# Patient Record
Sex: Female | Born: 1965
Health system: Southern US, Community
[De-identification: ages and names within clinical notes are randomized; demographics above are authoritative.]

## PROBLEM LIST (undated history)

## (undated) DIAGNOSIS — E669 Obesity, unspecified: Secondary | ICD-10-CM

## (undated) DIAGNOSIS — T7840XA Allergy, unspecified, initial encounter: Secondary | ICD-10-CM

## (undated) HISTORY — PX: DILATION AND CURETTAGE, DIAGNOSTIC / THERAPEUTIC: SUR384

## (undated) HISTORY — PX: MYOMECTOMY: SHX85

## (undated) HISTORY — DX: Obesity, unspecified: E66.9

## (undated) HISTORY — DX: Allergy, unspecified, initial encounter: T78.40XA

## (undated) HISTORY — PX: TONSILLECTOMY: SUR1361

---

## 1998-02-24 ENCOUNTER — Other Ambulatory Visit: Admission: RE | Admit: 1998-02-24 | Discharge: 1998-02-24 | Payer: Self-pay | Admitting: Obstetrics and Gynecology

## 1999-02-25 ENCOUNTER — Other Ambulatory Visit: Admission: RE | Admit: 1999-02-25 | Discharge: 1999-02-25 | Payer: Self-pay | Admitting: Obstetrics and Gynecology

## 2000-01-28 ENCOUNTER — Other Ambulatory Visit: Admission: RE | Admit: 2000-01-28 | Discharge: 2000-01-28 | Payer: Self-pay | Admitting: Obstetrics and Gynecology

## 2000-08-03 ENCOUNTER — Inpatient Hospital Stay (HOSPITAL_COMMUNITY): Admission: AD | Admit: 2000-08-03 | Discharge: 2000-08-06 | Payer: Self-pay | Admitting: Obstetrics and Gynecology

## 2000-08-10 ENCOUNTER — Encounter: Admission: RE | Admit: 2000-08-10 | Discharge: 2000-09-07 | Payer: Self-pay | Admitting: Obstetrics and Gynecology

## 2000-09-14 ENCOUNTER — Other Ambulatory Visit: Admission: RE | Admit: 2000-09-14 | Discharge: 2000-09-14 | Payer: Self-pay | Admitting: Obstetrics and Gynecology

## 2001-10-06 ENCOUNTER — Other Ambulatory Visit: Admission: RE | Admit: 2001-10-06 | Discharge: 2001-10-06 | Payer: Self-pay | Admitting: Obstetrics and Gynecology

## 2002-08-15 ENCOUNTER — Ambulatory Visit (HOSPITAL_COMMUNITY): Admission: RE | Admit: 2002-08-15 | Discharge: 2002-08-15 | Payer: Self-pay | Admitting: Obstetrics and Gynecology

## 2002-08-15 ENCOUNTER — Encounter (INDEPENDENT_AMBULATORY_CARE_PROVIDER_SITE_OTHER): Payer: Self-pay

## 2002-11-16 ENCOUNTER — Other Ambulatory Visit: Admission: RE | Admit: 2002-11-16 | Discharge: 2002-11-16 | Payer: Self-pay | Admitting: Obstetrics and Gynecology

## 2003-08-11 ENCOUNTER — Emergency Department (HOSPITAL_COMMUNITY): Admission: AD | Admit: 2003-08-11 | Discharge: 2003-08-11 | Payer: Self-pay | Admitting: Family Medicine

## 2003-08-16 ENCOUNTER — Emergency Department (HOSPITAL_COMMUNITY): Admission: AD | Admit: 2003-08-16 | Discharge: 2003-08-16 | Payer: Self-pay | Admitting: Family Medicine

## 2003-08-19 ENCOUNTER — Emergency Department (HOSPITAL_COMMUNITY): Admission: AD | Admit: 2003-08-19 | Discharge: 2003-08-19 | Payer: Self-pay | Admitting: Family Medicine

## 2003-12-04 ENCOUNTER — Other Ambulatory Visit: Admission: RE | Admit: 2003-12-04 | Discharge: 2003-12-04 | Payer: Self-pay | Admitting: Obstetrics and Gynecology

## 2004-12-28 ENCOUNTER — Other Ambulatory Visit: Admission: RE | Admit: 2004-12-28 | Discharge: 2004-12-28 | Payer: Self-pay | Admitting: Obstetrics and Gynecology

## 2005-02-15 ENCOUNTER — Emergency Department (HOSPITAL_COMMUNITY): Admission: EM | Admit: 2005-02-15 | Discharge: 2005-02-15 | Payer: Self-pay | Admitting: Family Medicine

## 2005-07-18 ENCOUNTER — Emergency Department (HOSPITAL_COMMUNITY): Admission: EM | Admit: 2005-07-18 | Discharge: 2005-07-18 | Payer: Self-pay | Admitting: Family Medicine

## 2005-10-21 ENCOUNTER — Ambulatory Visit: Payer: Self-pay

## 2008-06-30 HISTORY — PX: ABDOMINAL HYSTERECTOMY: SHX81

## 2008-07-22 ENCOUNTER — Ambulatory Visit (HOSPITAL_COMMUNITY): Admission: RE | Admit: 2008-07-22 | Discharge: 2008-07-23 | Payer: Self-pay | Admitting: Obstetrics and Gynecology

## 2008-07-22 ENCOUNTER — Encounter (INDEPENDENT_AMBULATORY_CARE_PROVIDER_SITE_OTHER): Payer: Self-pay | Admitting: Obstetrics and Gynecology

## 2010-09-08 ENCOUNTER — Emergency Department: Payer: Self-pay | Admitting: Internal Medicine

## 2011-01-12 NOTE — Op Note (Signed)
NAMETENITA, CUE                 ACCOUNT NO.:  [id number]   MEDICAL RECORD NO.:  [id number]          PATIENT TYPE:  OIB   LOCATION:  9317                          FACILITY:  [facility]   PHYSICIAN:  Guy Sandifer. Henderson Cloud, M.D. DATE OF BIRTH:  1966/06/04   DATE OF PROCEDURE:  07/22/2008  DATE OF DISCHARGE:                               OPERATIVE REPORT   PREOPERATIVE DIAGNOSIS:  Uterine leiomyomata.   POSTOPERATIVE DIAGNOSIS:  Uterine leiomyomata.   PROCEDURES:  1. Laparoscopically-assisted vaginal hysterectomy.  2. Biopsy of right fallopian tube.   SURGEON:  Guy Sandifer. Henderson Cloud, MD.   ASSISTANT:  Juluis Mire, MD.   ANESTHESIA:  General endotracheal intubation by Raul Del, MD.   SPECIMENS:  Uterus and biopsy of right fallopian tube to Pathology.   ESTIMATED BLOOD LOSS:  200 mL.   INDICATIONS AND CONSENT:  This patient is a 45 year old married white  female, G1, P1, with uterine leiomyomata and increasingly heavy menses.  Details are dictated in the history and physical.  Laparoscopically-  assisted vaginal hysterectomy and removal of the tube and ovary only if  distinctly abnormal was discussed preoperatively.  Potential risks and  complications have been discussed preoperatively including, but not  limited to infection, organ damage, bleeding requiring transfusion of  blood products with HIV and hepatitis acquisition, DVT, PE, pneumonia,  fistula formation, postoperative pain, dyspareunia, and laparotomy.  All  questions have been answered and consent is signed on the chart.   FINDINGS:  Upper abdomen is grossly normal.  Appendix is normal.  Uterus  is 6-8 weeks in size, irregular contour with intramural leiomyoma.  The  right fallopian tube has an adhesion to the posterior uterine fundus.  There is an 8-mm pedunculated paratubal cyst on the distal portion of  the right fallopian tube.  Left tube and ovary normal.  Anterior  posterior cul-de-sacs were normal.  The course of  the ureters were seen  to be well clear of surgery.   PROCEDURE:  The patient was taken to operating room where she was  identified and placed in dorsal supine position and general anesthesia  was induced via endotracheal intubation.  She was then placed in a  dorsal lithotomy position where she was prepped abdominally and  vaginally.  Bladder was straight catheterized.  Hulka tenaculum was  placed in the uterus as a manipulator, and she was draped in a sterile  fashion.  The infraumbilical and suprapubic areas were injected in the  midline with 5 mL of 1/2% plain Marcaine.  A small infraumbilical  incision was made and a disposable Veress needle was placed without  difficulty.  A normal syringe and drop test were noted.  A 2 L of gas  were then insufflated under low pressure with good tympany in the right  upper quadrant.  Veress needle was then removed.  A 10/11 XL bladeless  disposable trocar sleeve was placed using direct visualization with the  diagnostic laparoscope.  After placement, a small suprapubic incision  was made in the midline and a 5-mm disposable trocar sleeve was placed  under  direct visualization without difficulty.  The above findings were  noted.  The paratubal cyst on the right fallopian tube is removed and  sent to Pathology with the endoseal product.  The adhesion of the right  tube to the posterior uterine fundus was taken down in a similar  fashion.  The proximal ligaments were then taken down bilaterally to the  level of the vesicouterine peritoneum with good hemostasis.  Vesicouterine peritoneum was taken down cephalad laterally.  Instruments  were removed.  Pneumoperitoneum is reduced and attention is turned to  the vagina.  Posterior cul-de-sacs entered sharply.  Cervix is  circumscribed with unipolar cautery.  Mucosa is advanced sharply and  bluntly.  Progressive bites were taken with the LigaSure bipolar  handheld cautery device of the uterosacral  ligaments followed by the  bladder pillars, cardinal ligaments, and uterine vessels bilaterally.  Fundus was delivered posteriorly.  The remaining pedicles were taken  down with specimens delivered.  All suture will be 0 Monocryl unless  otherwise designated.  Uterosacral ligaments were plicated to the  vaginal cuff bilaterally with separate sutures.  They were then plicated  in the midline with a third suture.  Cuff was closed with figure-of-  eights.  Foley catheter was placed in the bladder and clear urine is  noted.  Attention was returned to the abdomen.  Small peritoneal  bleeders controlled with bipolar cautery with the endoseal product.  Copious irrigation is carried out.  An observation under reduced  pneumoperitoneum also reveals good hemostasis.  At the end of the case,  20 mL of 1/2% plain Marcaine is instilled into the peritoneal cavity.  All instruments were removed.  Umbilical incision and the suprapubic  incisions were closed with interrupted 3-0 Vicryl suture.  Dermabond was  placed on both incisions.  All counts were correct.  The patient is  awakened and taken to recovery room in stable condition.      Guy Sandifer Henderson Cloud, M.D.  Electronically Signed     JET/MEDQ  D:  07/22/2008  T:  07/22/2008  Job:  [id number]

## 2011-01-12 NOTE — H&P (Signed)
Tara Tanner, Tara Tanner                 ACCOUNT NO.:  [id number]   MEDICAL RECORD NO.:  [id number]          PATIENT TYPE:  AMB   LOCATION:                                FACILITY:  [facility]   PHYSICIAN:  Guy Sandifer. Henderson Cloud, M.D. DATE OF BIRTH:  [birth year]   DATE OF ADMISSION:  07/22/2008  DATE OF DISCHARGE:                              HISTORY & PHYSICAL   Admission on July 22, 2008.   CHIEF COMPLAINT:  Heavy menses.   HISTORY OF PRESENT ILLNESS:  This patient is a 45 year old, married  white female G1, P1 with known uterine leiomyomata who continues to have  worsening menses.  They are becoming quite heavy.  Ultrasound on  July 16, 2008 revealed a uterus measuring 7.7 x 4.0 x 5.8 cm.  Intramural fibroids measuring 1.9 and 1.5 cm are noted as well as a 2.2  cm subserosal fibroid.  Ovaries appear normal.  After discussion of  options, she is being admitted for laparoscopically-assisted vaginal  hysterectomy.  Potential risks and complications have been discussed  preoperatively.   PAST MEDICAL HISTORY:  History of abnormal Pap smear.   PAST SURGICAL HISTORY:  1. Uterine myomectomy.  2. D&C.   OBSTETRICAL HISTORY:  Cesarean section x1.   MEDICATIONS:  Birth control pill daily.   ALLERGIES:  CODEINE.   FAMILY HISTORY:  Positive for gallbladder disease, kidney disease,  osteoporosis, and arthritis.   SOCIAL HISTORY:  Denies tobacco, alcohol, or drug abuse.   REVIEW OF SYSTEMS:  NEURO:  Denies headache.  CARDIAC:  Denies chest pain.  PULMONARY:  Denies shortness of breath.   PHYSICAL EXAMINATION:  VITAL SIGNS:  Height 5 feet 4-1/4, weight 224.4  pounds, blood pressure 130/76.  LUNGS:  Clear to auscultation.  HEART:  Regular rate and rhythm.  ABDOMEN:  Soft and nontender without masses.  PELVIC:  Vulvovaginal and cervix without lesion.  Uterus is 6-8 weeks in  size.  Adnexa nontender without masses.  EXTREMITIES:  Grossly within normal limits.  NEUROLOGICAL:  Grossly within  normal limits.   ASSESSMENT:  Menorrhagia, fibroids.   PLAN:  Laparoscopically-assisted vaginal hysterectomy.      Guy Sandifer Henderson Cloud, M.D.  Electronically Signed     JET/MEDQ  D:  07/16/2008  T:  07/17/2008  Job:  [id number]

## 2011-01-12 NOTE — Discharge Summary (Signed)
NAMESIBLEY, ROLISON                 ACCOUNT NO.:  [id number]   MEDICAL RECORD NO.:  [id number]          PATIENT TYPE:  OIB   LOCATION:  9317                          [facility]ACILITY:  [facility]   PHYSICIAN:  Guy Sandifer. Henderson Cloud, M.D. DATE OF BIRTH:  December 21, 1965   DATE OF ADMISSION:  07/22/2008  DATE OF DISCHARGE:                               DISCHARGE SUMMARY   ADMITTING DIAGNOSIS:  Uterine leiomyomata.   DISCHARGE DIAGNOSIS:  Uterine leiomyomata.   PROCEDURE:  On July 22, 2008 is laparoscopically-assisted vaginal  hysterectomy and biopsy of right fallopian tube.   REASON FOR ADMISSION:  This patient is a 45 year old married white  female G1, P1 with increasingly symptomatic uterine leiomyomata.  Details dictated in history and physical.  She is admitted for surgical  management.   HOSPITAL COURSE:  The patient admitted to the hospital and undergoes the  above procedure.  Estimated blood loss is 200 mL.  On the evening of  surgery, she has good pain relief.  Vital signs are stable.  She is  afebrile with clear urine output.  On the day of discharge, she has  passed flatus, tolerating regular diet, and ambulating.  Vital signs are  stable.  She is afebrile.  Hemoglobin is 11.0.  Pathology is pending.   CONDITION ON DISCHARGE:  Good.   DIET:  Regular as tolerated.   ACTIVITY:  No vaginal entry, no heavy lifting, and no operation of  automobiles.  She is to call the office for problems including, but not  limited to heavy bleeding, temperature of 101 degrees, persistent  nausea, vomiting, or increasing pain.   CURRENT MEDICATIONS:  1. Percocet 5/325 mg, #40 1-2 p.o. q.6 h. p.r.n.  2. Ibuprofen q.6 h. p.r.n.  3. Multivitamin daily.  4. Follow up is in the office in 2 weeks.      Guy Sandifer Henderson Cloud, M.D.  Electronically Signed     JET/MEDQ  D:  07/23/2008  T:  07/23/2008  Job:  [id number]

## 2011-01-15 NOTE — Discharge Summary (Signed)
[facility]  Patient:    Tara Tanner, Tara Tanner                        MRN: [id number] Adm. Date:  [id number] Disc. Date: [id number] Attending:  Cordelia Pen Ii Dictator:   Danie Chandler, R.N.                           Discharge Summary  ADMITTING DIAGNOSES:          1. Intrauterine pregnancy at term.                               2. Status post uterine myomectomy.                               3. Spontaneous rupture of membranes.  DISCHARGE DIAGNOSES:          1. Intrauterine pregnancy at term.                               2. Status post uterine myomectomy.                               3. Spontaneous rupture of membranes.  PROCEDURE:                    On August 03, 2000 primary low transverse cesarean section.  REASON FOR ADMISSION:         The patient is a 45 year old married white female gravida 1, para 0 at term who had a previous uterine myomectomy and was scheduled for repeat cesarean section.  The patient also had mild elevation of blood pressures which were treated with additional rest and no further symptomatology.  [facility] and had spontaneous rupture of membranes after arriving at the hospital.  This was followed by mild uterine contractions.  Fetal heart tones remained reactive on the monitor.  HOSPITAL COURSE:              The patient was taken to the operating room and underwent the above named procedure without complication.  This was productive of a viable female infant with Apgars of 8 at one minute and 9 at five minutes and an arterial cord pH of 7.30.  Postoperatively the patient did well.  On postoperative day #1 the patient had a good return of bowel function, was tolerating a regular diet.  She also had good pain control and was ambulating well without difficulty.  Her hemoglobin on this day was 11.1, hematocrit 31.1, and white blood cell count 12.2.  On  postoperative day #2 the patient was without complaint and she was discharged home on postoperative day #3.  CONDITION ON DISCHARGE:       Good.  DIET:                         Regular, as tolerated.  ACTIVITY:                     No heavy lifting, no driving, no vaginal entry.  FOLLOW-UP:  She is to follow up in the office in one to two weeks for incision check.  She is to call for temperature greater than 100 degrees, persistent nausea or vomiting, heavy vaginal bleeding, and/or redness or drainage from the incision site.  DISCHARGE MEDICATIONS:        1. Prenatal vitamin one p.o. q.d.                               2. Motrin 600 mg one p.o. q.6h. p.r.n. pain.                               3. Percocet #30 one to two p.o. q.4h. p.r.n.                                  pain. DD:  08/31/00 TD:  08/31/00 Job: [id number]

## 2011-01-15 NOTE — Op Note (Signed)
[facility]  Patient:    Tara Tanner, Tara Tanner                        MRN: [id number] Proc. Date: 08/03/00 Adm. Date:  [id number] Attending:  Cordelia Pen Ii                           Operative Report  PREOPERATIVE DIAGNOSES:       1. Intrauterine pregnancy at term.                               2. Status post uterine myomectomy.                               3. Spontaneous rupture of membranes.  POSTOPERATIVE DIAGNOSES:      1. Intrauterine pregnancy at term.                               2. Status post uterine myomectomy.                               3. Spontaneous rupture of membranes.  OPERATION:                    Low transverse cesarean section.  SURGEON:                      Guy Sandifer. Arleta Creek, M.D.  ASSISTANT:                    Beather Arbour. Thomasena Edis, M.D.  ANESTHESIA:                   Spinal anesthesia per Belva Agee, M.D.  ESTIMATED BLOOD LOSS:         800 cc.  FINDINGS:                     A viable female infant with Apgars of 8 and 9 at one and five minutes, respectively.  Birth weight was 6 pounds 11 ounces. Arterial cord pH 7.30.  INDICATIONS AND CONSENT:      This patient is a 45 year old married white female G1, P0 at term who has had previous uterine myomectomy and is scheduled for repeat cesarean section.  She has also had mild elevation of blood pressures which were treated with additional rest and no further symptomatology.  Group B beta strep culture is positive. She presents to [facility] for her scheduled cesarean section and had spontaneous rupture of membranes after arriving at the hospital.  This is followed by mild uterine contractions. Fetal heart tones are reactive on the monitor.  The patient is without complaints of central nervous system changes or epigastric pain.  Cesarean section has been discussed with the patient and all questions have been answered.  DESCRIPTION OF PROCEDURE:     The patient is  taken to the operating room where a spinal anesthetic is placed.  She is then placed in the dorsal supine position with a 15 degree left lateral wedge.  She is prepped abdominally. Foley catheter is placed is placed in the bladder as a drain and  she is draped in a sterile fashion.  After testing for adequate spinal anesthesia, the skin is entered through the previous low transverse scar and dissection is carried out to the peritoneum which is incised sharply and extended superiorly and inferiorly. The vesicouterine peritoneum is taken down cephalolaterally.  The bladder flap is developed and the bladder blade is placed.  The uterus is then incised in a low transverse manner and the uterine cavity is entered bluntly with a Kelly clamp.  The uterine incision is then extended cephalolaterally with the fingers. The vertex is then delivered without difficulty and the oropharynx and nasopharynx are suctioned.  The remainder of the infant is delivered and good cry and tone is noted.  The cord is clamped and cut and the infant is handed to the awaiting pediatrics team. The placenta is manually delivered.  The internal uterine contour is noted to be heart-shaped.  The cavity is clean.  The uterus is closed in a running locking fashion with 0 Monocryl suture which achieves good hemostasis.  Tubes and ovaries are normal. There is a 5 mm subserosal fibroid on the anterior fundus which is cauterized. The anterior peritoneum is then closed with 0 Monocryl suture which is also used to reapproximate the pyramidalis muscle in the midline.  The anterior rectus fascia is closed in a running fashion with PDS suture and the skin is closed with clips. All sponge, needle and instrument counts are correct and the patient is transferred to the recovery room in stable condition. DD:  08/03/00 TD:  08/03/00 Job: [id number]

## 2011-01-15 NOTE — Op Note (Signed)
NAME:  Tara Tanner, Tara Tanner                           ACCOUNT NO.:  [id number]   MEDICAL RECORD NO.:  [id number]                   PATIENT TYPE:  AMB   LOCATION:  SDC                                  FACILITY:  [facility]   PHYSICIAN:  Guy Sandifer. Arleta Creek, M.D.           DATE OF BIRTH:  1966/02/19   DATE OF PROCEDURE:  08/15/2002  DATE OF DISCHARGE:                                 OPERATIVE REPORT   PREOPERATIVE DIAGNOSIS:  Menorrhagia.   POSTOPERATIVE DIAGNOSIS:  Menorrhagia.   PROCEDURE:  Hysteroscopy with dilatation and curettage.   SURGEON:  Guy Sandifer. Henderson Cloud, M.D.   ANESTHESIA:  MAC with 1% Xylocaine paracervical block, 20 cc total.   ESTIMATED BLOOD LOSS:  Less than or equal to 50 cc.   INPUT AND OUTPUT:  Sorbitol distending media - 80 cc deficit with some of  that being on the floor.   SPECIMENS:  Endometrial curettings.   INDICATIONS AND CONSENT:  This patient is a 45 year old married white  female, G1, P1 with a known history of uterine myomata with postcoital  bleeding and menorrhagia.  Details dictated in History and Physical.  Hysteroscopy with possible resectoscope, dilatation and curettage was  discussed with the patient.  Potential risks and complications had been  discussed preoperatively including, but not limited to infection, uterine  perforation, bowel, bladder or ureteral damage, bleeding requiring  transfusion of blood products, possible transfusion reaction, HIV and  hepatitis acquisition, DVT, PE, pneumonia, hysterectomy, laparotomy,  postoperative intrauterine synechiae, and secondary infertility.  All  questions have been answered and consent was signed on the chart.   FINDINGS:  The endometrial cavity was without any abnormal structures or  vessels.   PROCEDURE:  The patient was taken to the operating room and placed in the  dorsal supine position, where she was given sedation.  She was then placed  in the dorsal lithotomy position, where was gently prepped,  bladder straight  catheterized, and she was draped in a sterile fashion.  Bivalve speculum was  placed in the vagina and anterior cervical lip was injected with 1%  Xylocaine and then grasped with a single-tooth tenaculum.  Paracervical  block had been placed in the 2, 4, 5, 7, 8, and 10 o'clock positions with  approximately 20 cc total of 1% Xylocaine plain.  The cervix was gently  progressively dilated to a 25 Pratt dilator.  Diagnostic hysteroscope was  placed in the cervical canal and advanced under direct visualization using  sorbitol distending media.  The above findings were noted.  The hysteroscope  was withdrawn and sharp curettage was carried out.  The hysteroscope was  then reintroduced and  again advanced under direct visualization and the cavity was totally clean.  The hysteroscope was withdrawn.  All counts were correct.  The patient was  awakened and taken to the recovery room in stable condition.  She did  receive preoperative IV antibiotics.  Guy Sandifer Arleta Creek, M.D.    JET/MEDQ  D:  08/15/2002  T:  08/15/2002  Job:  [id number]

## 2011-01-15 NOTE — H&P (Signed)
NAME:  Tara Tanner, Tara Tanner                           ACCOUNT NO.:  [id number]   MEDICAL RECORD NO.:  [id number]                   PATIENT TYPE:  AMB   LOCATION:  SDC                                  [facility]ACILITY:  [facility]   PHYSICIAN:  Guy Sandifer. Arleta Creek, M.D.           DATE OF BIRTH:  [birth year]   DATE OF ADMISSION:  08/15/2002  DATE OF DISCHARGE:                                HISTORY & PHYSICAL   CHIEF COMPLAINT:  Postcoital bleeding as well as heavy menses.   HISTORY OF PRESENT ILLNESS:  The patient is a 45 year old married white  female G1, P1 on condoms for contraception.  She has a history of uterine  leiomyomata and is status post uterine myomectomy in 1995.  She has  postcoital bleeding enough to stain the bed sheets.  She is also having  progressively heavier menses lasting 2 weeks at a time.  She is changing a  pad every 2 hours with blood clotting.  Physical examination reveals a  uterus that is upper limits of normal on size.  Ultrasound measures the  uterus at 7.6 x 4.9 x 3.8 cm with two leiomyomata noted to be 1.4 cm,  respectively.  Ovaries are normal.  Sonohistogram is consistent with a 5.6  mm density possibly representing a polyp.  The endometrial thickness is 9.2  mm.  After discussion of the options, she is being admitted for  hysteroscopy, dilation and curettage.   PAST MEDICAL HISTORY:  Environmental allergies.   PAST SURGICAL HISTORY:  1. Myomectomy in 1995.  2. Hernia repair in 1996.   OBSTETRIC HISTORY:  Cesarean section x1.   FAMILY HISTORY:  Heart attack in maternal grandfather.  Adult-onset diabetes  maternal aunt.  Juvenile diabetes in first cousin.  Lupus in maternal  grandfather.   MEDICATIONS:  None.   ALLERGY:  CODEINE leading to nausea and vomiting.   SOCIAL HISTORY:  The patient denies tobacco, alcohol, or drug abuse.   REVIEW OF SYSTEMS:  Negative except as above.   PHYSICAL EXAMINATION:  VITAL SIGNS:  Height 5 feet 5-1/2 inches, weight 205  pounds, blood pressure 108/60.  HEENT:  Without thyromegaly.  LUNGS:  Clear to auscultation.  HEART:  Regular rate and rhythm.  BACK:  Without CVA tenderness.  BREASTS:  Without mass, retraction, discharge.  ABDOMEN:  Soft, nontender, without masses.  PELVIC:  Vulva, vagina, cervix without lesion.  Uterus upper limits of  normal size, mobile, nontender.  Adnexa nontender without masses.  EXTREMITIES:  Grossly within normal limits.  NEUROLOGICAL:  Grossly within normal limits.   ASSESSMENT:  Postcoital bleeding and menorrhagia.   PLAN:  Hysteroscopy, D&C.                                               Fayrene Fearing  Enis Slipper, M.D.    JET/MEDQ  D:  08/13/2002  T:  08/13/2002  Job:  [id number]

## 2011-06-01 LAB — CBC
HCT: 31.3 — ABNORMAL LOW
HCT: 37.9
Hemoglobin: 11 — ABNORMAL LOW
Hemoglobin: 13.1
MCHC: 34.6
MCHC: 35.2
MCV: 87.2
MCV: 87.6
Platelets: 337
Platelets: 449 — ABNORMAL HIGH
RBC: 3.57 — ABNORMAL LOW
RBC: 4.34
RDW: 12.9
RDW: 12.9
WBC: 10.6 — ABNORMAL HIGH
WBC: 14 — ABNORMAL HIGH

## 2011-06-01 LAB — HCG, SERUM, QUALITATIVE: Preg, Serum: NEGATIVE

## 2011-11-15 ENCOUNTER — Encounter: Payer: Self-pay | Admitting: Family Medicine

## 2011-11-15 ENCOUNTER — Ambulatory Visit (INDEPENDENT_AMBULATORY_CARE_PROVIDER_SITE_OTHER): Payer: Medicare HMO | Admitting: Family Medicine

## 2011-11-15 VITALS — BP 110/78 | HR 60 | Temp 98.3°F | Ht 64.25 in | Wt 237.0 lb

## 2011-11-15 DIAGNOSIS — E669 Obesity, unspecified: Secondary | ICD-10-CM

## 2011-11-15 DIAGNOSIS — E66813 Obesity, class 3: Secondary | ICD-10-CM | POA: Insufficient documentation

## 2011-11-15 NOTE — Patient Instructions (Addendum)
Nice to meet you. Keep a food journal and try to increase physical activity. Please make an appointment to come see me in one month.

## 2011-11-15 NOTE — Progress Notes (Signed)
  Subjective:    Patient ID: Tara Tanner, female    DOB: Mar 06, 1966, 46 y.o.   MRN: [id number]  HPI  46 yo here to establish care.    G1P1 s/p vag hys due to fibroids, followed by Dr. Henderson Cloud.  Obesity- Was denied disability insurance due to her weight- BMI is 40.36. Brings in lab work from her assessment- (fasting): Glucose 55 TG 201, LDL 66, HDL 61 Cr 0.80  She used to be very active- ran 3 miles a day. Now snacks at work a lot, very sedentary job. When she gets home, too tired and busy with daughter to exercise.  Motivated to change.  Has tried [employer]- didn't like the point system.  Patient Active Problem List  Diagnoses  . Obesity   Past Medical History  Diagnosis Date  . Obesity    Past Surgical History  Procedure Date  . Abdominal hysterectomy 06/2008    vag hys secondary to Uterine leiomyomata  . Myomectomy    History  Substance Use Topics  . Smoking status: Never Smoker   . Smokeless tobacco: Not on file  . Alcohol Use: Not on file   Family History  Problem Relation Age of Onset  . Stroke Mother    Allergies  Allergen Reactions  . Codeine Nausea And Vomiting    Hallucinations   No current outpatient prescriptions on file prior to visit.   The PMH, PSH, Social History, Family History, Medications, and allergies have been reviewed in [facility], and have been updated if relevant.     Review of Systems See HPI    Objective:   Physical Exam BP 110/78  Pulse 60  Temp(Src) 98.3 F (36.8 C) (Oral)  Ht 5' 4.25" (1.632 m)  Wt 237 lb (107.502 kg)  BMI 40.36 kg/m2  General:  Well-developed,well-nourished,in no acute distress; alert,appropriate and cooperative throughout examination Head:  normocephalic and atraumatic.   Eyes:  vision grossly intact, pupils equal, pupils round, and pupils reactive to light.   Ears:  R ear normal and L ear normal.   Nose:  no external deformity.   Lungs:  Normal respiratory effort, chest expands symmetrically. Lungs are  clear to auscultation, no crackles or wheezes. Heart:  Normal rate and regular rhythm. S1 and S2 normal without gallop, murmur, click, rub or other extra sounds. Msk:  No deformity or scoliosis noted of thoracic or lumbar spine.   Extremities:  No clubbing, cyanosis, edema, or deformity noted with normal full range of motion of all joints.   Neurologic:  alert & oriented X3 and gait normal.   Skin:  Intact without suspicious lesions or rashes Psych:  Cognition and judgment appear intact. Alert and cooperative with normal attention span and concentration. No apparent delusions, illusions, hallucinations     Assessment and Plan: 1. Obesity    >30 min spent with face to face with patient, >50% counseling and/or coordinating care. Advised keeping food journal. Discussed phentermine.  She will attempt to loose weight on her own- improve diet and increase exercise for one month. Follow up with me in one month.

## 2011-12-16 ENCOUNTER — Encounter: Payer: Self-pay | Admitting: Family Medicine

## 2011-12-16 ENCOUNTER — Ambulatory Visit (INDEPENDENT_AMBULATORY_CARE_PROVIDER_SITE_OTHER): Payer: Medicare HMO | Admitting: Family Medicine

## 2011-12-16 VITALS — BP 130/80 | HR 60 | Temp 98.2°F | Wt 240.0 lb

## 2011-12-16 DIAGNOSIS — E669 Obesity, unspecified: Secondary | ICD-10-CM

## 2011-12-16 MED ORDER — PHENTERMINE HCL 15 MG PO CAPS: 15.0000 mg | ORAL_CAPSULE | ORAL | Status: DC

## 2011-12-16 NOTE — Patient Instructions (Signed)
Please follow up with me 1 month.    Phentermine tablets or capsules What is this medicine? PHENTERMINE (FEN ter meen) decreases your appetite. It is used with a reduced calorie diet and exercise to help you lose weight. This medicine may be used for other purposes; ask your health care provider or pharmacist if you have questions. What should I tell my health care provider before I take this medicine? They need to know if you have any of these conditions: -agitation -glaucoma -heart disease -high blood pressure -history of substance abuse -lung disease called Primary Pulmonary Hypertension (PPH) -taken an MAOI like Carbex, Eldepryl, Marplan, Nardil, or Parnate in last 14 days -thyroid disease -an unusual or allergic reaction to phentermine, other medicines, foods, dyes, or preservatives -pregnant or trying to get pregnant -breast-feeding How should I use this medicine? Take this medicine by mouth with a glass of water. Follow the directions on the prescription label. This medicine is usually taken 30 minutes before or 1 to 2 hours after breakfast. Avoid taking this medicine in the evening. It may interfere with sleep. Take your doses at regular intervals. Do not take your medicine more often than directed. Talk to your pediatrician regarding the use of this medicine in children. Special care may be needed. Overdosage: If you think you have taken too much of this medicine contact a poison control center or emergency room at once. NOTE: This medicine is only for you. Do not share this medicine with others. What if I miss a dose? If you miss a dose, take it as soon as you can. If it is almost time for your next dose, take only that dose. Do not take double or extra doses. What may interact with this medicine? Do not take this medicine with any of the following medications: -duloxetine -MAOIs like Carbex, Eldepryl, Marplan, Nardil, and Parnate -medicines for colds or breathing  difficulties like pseudoephedrine or phenylephrine -procarbazine -sibutramine -SSRIs like citalopram, escitalopram, fluoxetine, fluvoxamine, paroxetine, and sertraline -stimulants like dexmethylphenidate, methylphenidate or modafinil -venlafaxine This medicine may also interact with the following medications: -medicines for diabetes This list may not describe all possible interactions. Give your health care provider a list of all the medicines, herbs, non-prescription drugs, or dietary supplements you use. Also tell them if you smoke, drink alcohol, or use illegal drugs. Some items may interact with your medicine. What should I watch for while using this medicine? Notify your physician immediately if you become short of breath while doing your normal activities. Do not take this medicine within 6 hours of bedtime. It can keep you from getting to sleep. Avoid drinks that contain caffeine and try to stick to a regular bedtime every night. This medicine was intended to be used in addition to a healthy diet and exercise. The best results are achieved this way. This medicine is only indicated for short-term use. Eventually your weight loss may level out. At that point, the drug will only help you maintain your new weight. Do not increase or in any way change your dose without consulting your doctor. You may get drowsy or dizzy. Do not drive, use machinery, or do anything that needs mental alertness until you know how this medicine affects you. Do not stand or sit up quickly, especially if you are an older patient. This reduces the risk of dizzy or fainting spells. Alcohol may increase dizziness and drowsiness. Avoid alcoholic drinks. What side effects may I notice from receiving this medicine? Side effects that you should  report to your doctor or health care professional as soon as possible: -chest pain, palpitations -depression or severe changes in mood -increased blood  pressure -irritability -nervousness or restlessness -severe dizziness -shortness of breath -problems urinating -unusual swelling of the legs -vomiting Side effects that usually do not require medical attention (report to your doctor or health care professional if they continue or are bothersome): -blurred vision or other eye problems -changes in sexual ability or desire -constipation or diarrhea -difficulty sleeping -dry mouth or unpleasant taste -headache -nausea This list may not describe all possible side effects. Call your doctor for medical advice about side effects. You may report side effects to FDA at 1-800-FDA-1088. Where should I keep my medicine? Keep out of the reach of children. This medicine can be abused. Keep your medicine in a safe place to protect it from theft. Do not share this medicine with anyone. Selling or giving away this medicine is dangerous and against the law. Store at room temperature between 20 and 25 degrees C (68 and 77 degrees F). Keep container tightly closed. Throw away any unused medicine after the expiration date. NOTE: This sheet is a summary. It may not cover all possible information. If you have questions about this medicine, talk to your doctor, pharmacist, or health care provider.  2012, [employer]. (09/30/2010 11:02:44 AM)

## 2011-12-16 NOTE — Progress Notes (Signed)
  Subjective:    Patient ID: Tara Tanner, female    DOB: [birth year], 46 y.o.   MRN: [id number]  HPI  45 yo here to follow up obesity.  Has been exercising and cutting back. Lost 3 pounds since last office visit. She would like to go ahead and start phentermine.  She is snacking too much at work.  Needs something to suppress her appetite during the day.  No h/o CAD or palpitations. No h/o HTN.   Patient Active Problem List  Diagnoses  . Obesity   Past Medical History  Diagnosis Date  . Obesity    Past Surgical History  Procedure Date  . Abdominal hysterectomy 06/2008    vag hys secondary to Uterine leiomyomata  . Myomectomy    History  Substance Use Topics  . Smoking status: Never Smoker   . Smokeless tobacco: Not on file  . Alcohol Use: Not on file   Family History  Problem Relation Age of Onset  . Stroke Mother    Allergies  Allergen Reactions  . Codeine Nausea And Vomiting    Hallucinations    The PMH, PSH, Social History, Family History, Medications, and allergies have been reviewed in [facility], and have been updated if relevant.     Review of Systems See HPI    Objective:   Physical Exam BP 130/80  Pulse 60  Temp(Src) 98.2 F (36.8 C) (Oral)  Wt 240 lb (108.863 kg) Wt Readings from Last 3 Encounters:  12/16/11 240 lb (108.863 kg)  11/15/11 237 lb (107.502 kg)    General:  Well-developed,well-nourished,in no acute distress; alert,appropriate and cooperative throughout examination Head:  normocephalic and atraumatic.   Eyes:  vision grossly intact, pupils equal, pupils round, and pupils reactive to light.   Ears:  R ear normal and L ear normal.   Nose:  no external deformity.   Lungs:  Normal respiratory effort, chest expands symmetrically. Lungs are clear to auscultation, no crackles or wheezes. Heart:  Normal rate and regular rhythm. S1 and S2 normal without gallop, murmur, click, rub or other extra sounds. Msk:  No deformity or scoliosis  noted of thoracic or lumbar spine.   Extremities:  No clubbing, cyanosis, edema, or deformity noted with normal full range of motion of all joints.   Neurologic:  alert & oriented X3 and gait normal.   Skin:  Intact without suspicious lesions or rashes Psych:  Cognition and judgment appear intact. Alert and cooperative with normal attention span and concentration. No apparent delusions, illusions, hallucinations     Assessment and Plan: 1. Obesity    >15 min spent with face to face with patient, >50% counseling and/or coordinating care. Start phentermine- she is aware of risks, including HTN, pulmonary HTN, palpitations and would like to proceed. Pt to follow up in 1 month. See pt instructions for details.

## 2012-01-20 ENCOUNTER — Telehealth: Payer: Self-pay

## 2012-01-20 NOTE — Telephone Encounter (Signed)
Pt left v/m taking Phentermine 3 weeks and needs stronger dosage or med.[facility]

## 2012-01-20 NOTE — Telephone Encounter (Signed)
Needs to have monthly follow up in order to increase dose. We have to recheck BP before we can refill it.

## 2012-01-21 NOTE — Telephone Encounter (Signed)
Left message on voice mail asking pt to call back. 

## 2012-01-21 NOTE — Telephone Encounter (Signed)
Spoke with patient, she has appt next Friday.

## 2012-01-28 ENCOUNTER — Ambulatory Visit: Payer: Medicare HMO | Admitting: Family Medicine

## 2012-02-03 ENCOUNTER — Ambulatory Visit (INDEPENDENT_AMBULATORY_CARE_PROVIDER_SITE_OTHER): Payer: Medicare HMO | Admitting: Family Medicine

## 2012-02-03 ENCOUNTER — Encounter: Payer: Self-pay | Admitting: Family Medicine

## 2012-02-03 VITALS — BP 120/74 | HR 63 | Temp 98.3°F | Ht 65.0 in | Wt 236.8 lb

## 2012-02-03 DIAGNOSIS — E669 Obesity, unspecified: Secondary | ICD-10-CM

## 2012-02-03 MED ORDER — PHENTERMINE HCL 30 MG PO CAPS: 30.0000 mg | ORAL_CAPSULE | ORAL | Status: DC

## 2012-02-03 NOTE — Progress Notes (Signed)
  Subjective:    Patient ID: Tara Tanner, female    DOB: 29-Jul-1966, 46 y.o.   MRN: [id number]  HPI  46 yo here to follow up obesity.  Has been exercising and cutting back. Started phentermine 15 mg daily last month.  She is snacking too much at work.  Needs something to suppress her appetite during the day. Felt her appetite was not suppressed enough at this dose.  No adverse side effects- no palpitations, CP, SOB or insomnia.    Patient Active Problem List  Diagnoses  . Obesity   Past Medical History  Diagnosis Date  . Obesity    Past Surgical History  Procedure Date  . Abdominal hysterectomy 06/2008    vag hys secondary to Uterine leiomyomata  . Myomectomy    History  Substance Use Topics  . Smoking status: Never Smoker   . Smokeless tobacco: Not on file  . Alcohol Use: Not on file   Family History  Problem Relation Age of Onset  . Stroke Mother    Allergies  Allergen Reactions  . Codeine Nausea And Vomiting    Hallucinations    The PMH, PSH, Social History, Family History, Medications, and allergies have been reviewed in [facility], and have been updated if relevant.     Review of Systems See HPI    Objective:   Physical Exam BP 120/74  Pulse 63  Temp(Src) 98.3 F (36.8 C) (Oral)  Ht 5\' 5"  (1.651 m)  Wt 236 lb 12.8 oz (107.412 kg)  BMI 39.41 kg/m2  SpO2 97% Wt Readings from Last 3 Encounters:  02/03/12 236 lb 12.8 oz (107.412 kg)  12/16/11 240 lb (108.863 kg)  11/15/11 237 lb (107.502 kg)    General:  Well-developed,well-nourished,in no acute distress; alert,appropriate and cooperative throughout examination Head:  normocephalic and atraumatic.   Eyes:  vision grossly intact, pupils equal, pupils round, and pupils reactive to light.   Ears:  R ear normal and L ear normal.   Nose:  no external deformity.   Lungs:  Normal respiratory effort, chest expands symmetrically. Lungs are clear to auscultation, no crackles or wheezes. Heart:  Normal  rate and regular rhythm. S1 and S2 normal without gallop, murmur, click, rub or other extra sounds. Msk:  No deformity or scoliosis noted of thoracic or lumbar spine.   Extremities:  No clubbing, cyanosis, edema, or deformity noted with normal full range of motion of all joints.   Neurologic:  alert & oriented X3 and gait normal.   Skin:  Intact without suspicious lesions or rashes Psych:  Cognition and judgment appear intact. Alert and cooperative with normal attention span and concentration. No apparent delusions, illusions, hallucinations     Assessment and Plan: 1. Obesity    Unchanged. Increase dose of phentermine to 30 mg daily. Pt aware of risks, including HTN, pulmonary HTN, cardiac complications. Follow up in 1 month.

## 2012-03-06 ENCOUNTER — Ambulatory Visit (INDEPENDENT_AMBULATORY_CARE_PROVIDER_SITE_OTHER): Payer: Medicare HMO | Admitting: Family Medicine

## 2012-03-06 DIAGNOSIS — E669 Obesity, unspecified: Secondary | ICD-10-CM

## 2012-03-06 NOTE — Progress Notes (Signed)
  Subjective:    Patient ID: Tara Tanner, female    DOB: 1966/06/24, 46 y.o.   MRN: [id number]  HPI     No show.

## 2012-09-18 ENCOUNTER — Other Ambulatory Visit: Payer: Self-pay | Admitting: Family Medicine

## 2012-09-18 DIAGNOSIS — Z0000 Encounter for general adult medical examination without abnormal findings: Secondary | ICD-10-CM

## 2012-09-18 DIAGNOSIS — Z136 Encounter for screening for cardiovascular disorders: Secondary | ICD-10-CM

## 2012-09-22 ENCOUNTER — Other Ambulatory Visit (INDEPENDENT_AMBULATORY_CARE_PROVIDER_SITE_OTHER): Payer: Managed Care, Other (non HMO)

## 2012-09-22 DIAGNOSIS — Z136 Encounter for screening for cardiovascular disorders: Secondary | ICD-10-CM

## 2012-09-22 DIAGNOSIS — Z0000 Encounter for general adult medical examination without abnormal findings: Secondary | ICD-10-CM

## 2012-09-22 LAB — COMPREHENSIVE METABOLIC PANEL
ALT: 25 U/L (ref 0–35)
AST: 20 U/L (ref 0–37)
Albumin: 4.5 g/dL (ref 3.5–5.2)
BUN: 15 mg/dL (ref 6–23)
CO2: 28 mEq/L (ref 19–32)
Calcium: 10.1 mg/dL (ref 8.4–10.5)
Chloride: 102 mEq/L (ref 96–112)
Creatinine, Ser: 0.8 mg/dL (ref 0.4–1.2)
GFR: 81.41 mL/min (ref >60.00)
Glucose, Bld: 88 mg/dL (ref 70–99)
Potassium: 4 mEq/L (ref 3.5–5.1)
Sodium: 138 mEq/L (ref 135–145)
Total Bilirubin: 0.6 mg/dL (ref 0.3–1.2)
Total Protein: 8.2 g/dL (ref 6.0–8.3)

## 2012-09-22 LAB — LIPID PANEL
Cholesterol: 154 mg/dL (ref 0–200)
HDL: 63.1 mg/dL (ref >39.00)
Total CHOL/HDL Ratio: 2
VLDL: 16.4 mg/dL (ref 0.0–40.0)

## 2012-09-27 ENCOUNTER — Encounter: Payer: Medicare HMO | Admitting: Family Medicine

## 2012-10-06 ENCOUNTER — Ambulatory Visit (INDEPENDENT_AMBULATORY_CARE_PROVIDER_SITE_OTHER): Payer: Managed Care, Other (non HMO) | Admitting: Family Medicine

## 2012-10-06 ENCOUNTER — Encounter: Payer: Self-pay | Admitting: Family Medicine

## 2012-10-06 VITALS — BP 120/80 | HR 60 | Temp 98.2°F | Ht 64.25 in | Wt 230.0 lb

## 2012-10-06 DIAGNOSIS — Z0000 Encounter for general adult medical examination without abnormal findings: Secondary | ICD-10-CM

## 2012-10-06 DIAGNOSIS — D225 Melanocytic nevi of trunk: Secondary | ICD-10-CM

## 2012-10-06 DIAGNOSIS — E669 Obesity, unspecified: Secondary | ICD-10-CM

## 2012-10-06 DIAGNOSIS — D235 Other benign neoplasm of skin of trunk: Secondary | ICD-10-CM

## 2012-10-06 MED ORDER — PHENTERMINE HCL 30 MG PO CAPS: 30.0000 mg | ORAL_CAPSULE | ORAL | Status: AC

## 2012-10-06 NOTE — Patient Instructions (Addendum)
Great to see you. Please stop by to see Shirlee Limerick on your way out to set up your dermatology referral.  We are restarting phentermine.  Please come back in 1 month for a follow up.  Let me know if you have had your tetanus shot in the last 10 years.  Have a great weekend!

## 2012-10-06 NOTE — Progress Notes (Signed)
Subjective:    Patient ID: Tara Tanner, female    DOB: 1965/10/21, 47 y.o.   MRN: [id number]  HPI  47 yo here for CPX.  Obesity- tried phentermine this summer but was lost to follow up.  Had a new boss at work that would force her to cancel doctors appointments at last minute.  Situation is much better now.   She has lost 6 pounds since her last office visit- walking more and playing basketball with her 61 year old daughter.   Wt Readings from Last 3 Encounters:  10/06/12 230 lb (104.327 kg)  02/03/12 236 lb 12.8 oz (107.412 kg)  12/16/11 240 lb (108.863 kg)    No adverse side effects when she did take phentermine- no palpitations, CP, SOB or insomnia.  S/p Hysterectomy for DUB. Seeing Dr. Henderson Cloud at the end of the month. Mammogram already scheduled for the end of the month.   Lab Results  Component Value Date   CHOL 154 09/22/2012   HDL 63.10 09/22/2012   LDLCALC 75 09/22/2012   TRIG 82.0 09/22/2012   CHOLHDL 2 09/22/2012      Patient Active Problem List  Diagnosis  . Obesity  . Routine general medical examination at a health care facility   Past Medical History  Diagnosis Date  . Obesity    Past Surgical History  Procedure Date  . Abdominal hysterectomy 06/2008    vag hys secondary to Uterine leiomyomata  . Myomectomy    History  Substance Use Topics  . Smoking status: Never Smoker   . Smokeless tobacco: Not on file  . Alcohol Use: Not on file   Family History  Problem Relation Age of Onset  . Stroke Mother    Allergies  Allergen Reactions  . Codeine Nausea And Vomiting    Hallucinations    The PMH, PSH, Social History, Family History, Medications, and allergies have been reviewed in [facility], and have been updated if relevant.     Review of Systems See HPI    Patient reports no  vision/ hearing changes,anorexia, weight change, fever ,adenopathy, persistant / recurrent hoarseness, swallowing issues, chest pain, edema,persistant / recurrent cough,  hemoptysis, dyspnea(rest, exertional, paroxysmal nocturnal), gastrointestinal  bleeding (melena, rectal bleeding), abdominal pain, excessive heart burn, GU symptoms(dysuria, hematuria, pyuria, voiding/incontinence  Issues) syncope, focal weakness, severe memory loss, concerning skin lesions, depression, anxiety, abnormal bruising/bleeding, major joint swelling, breast masses or abnormal vaginal bleeding.    Objective:   Physical Exam BP 120/80  Pulse 60  Temp 98.2 F (36.8 C)  Ht 5' 4.25" (1.632 m)  Wt 230 lb (104.327 kg)  BMI 39.17 kg/m2  Wt Readings from Last 3 Encounters:  02/03/12 236 lb 12.8 oz (107.412 kg)  12/16/11 240 lb (108.863 kg)  11/15/11 237 lb (107.502 kg)    General:  Well-developed,well-nourished,in no acute distress; alert,appropriate and cooperative throughout examination Head:  normocephalic and atraumatic.   Eyes:  vision grossly intact, pupils equal, pupils round, and pupils reactive to light.   Ears:  R ear normal and L ear normal.   Nose:  no external deformity.   Lungs:  Normal respiratory effort, chest expands symmetrically. Lungs are clear to auscultation, no crackles or wheezes. Heart:  Normal rate and regular rhythm. S1 and S2 normal without gallop, murmur, click, rub or other extra sounds. Msk:  No deformity or scoliosis noted of thoracic or lumbar spine.   Extremities:  No clubbing, cyanosis, edema, or deformity noted with normal full range of motion  of all joints.   Neurologic:  alert & oriented X3 and gait normal.   Skin:   Small nevus lateral to midline of back, irregular borders with darker center pigmentation Psych:  Cognition and judgment appear intact. Alert and cooperative with normal attention span and concentration. No apparent delusions, illusions, hallucinations     Assessment and Plan: 1. Routine general medical examination at a health care facility  Reviewed preventive care protocols, scheduled due services, and updated  immunizations Discussed nutrition, exercise, diet, and healthy lifestyle.   2. Obesity  Improved but BMI remains above 39.  Encouraged her to continue walking.  Will restart phentermine.  She will follow up in one month. She is aware of risks of phentermine.   3. Atypical nevus of back  Refer to derm for removal/biopsy. The patient indicates understanding of these issues and agrees with the plan.  Ambulatory referral to Dermatology

## 2012-10-09 ENCOUNTER — Ambulatory Visit (INDEPENDENT_AMBULATORY_CARE_PROVIDER_SITE_OTHER): Payer: Managed Care, Other (non HMO) | Admitting: Family Medicine

## 2012-10-09 ENCOUNTER — Encounter: Payer: Self-pay | Admitting: Family Medicine

## 2012-10-09 VITALS — BP 104/70 | HR 67 | Temp 97.9°F | Wt 232.0 lb

## 2012-10-09 DIAGNOSIS — J069 Acute upper respiratory infection, unspecified: Secondary | ICD-10-CM

## 2012-10-09 DIAGNOSIS — N390 Urinary tract infection, site not specified: Secondary | ICD-10-CM

## 2012-10-09 NOTE — Patient Instructions (Addendum)
This is likely viral.  Drink lots of fluids.  Treat sympotmatically with Mucinex, nasal saline irrigation, and Tylenol/Ibuprofen. Also try claritin D or zyrtec D over the counter- two times a day as needed ( have to sign for them at pharmacy). You can use warm compresses.  Cough suppressant at night. Call if not improving as expected in 5-7 days.

## 2012-10-09 NOTE — Progress Notes (Signed)
SUBJECTIVE:  Tara Tanner is a 47 y.o. female who complains of coryza, congestion, sneezing and sore throat for 3 days. She denies a history of anorexia, chest pain, fevers, myalgias and shortness of breath and denies a history of asthma. Patient denies smoke cigarettes.   Patient Active Problem List  Diagnosis  . Obesity   Past Medical History  Diagnosis Date  . Obesity    Past Surgical History  Procedure Laterality Date  . Abdominal hysterectomy  06/2008    vag hys secondary to Uterine leiomyomata  . Myomectomy     History  Substance Use Topics  . Smoking status: Never Smoker   . Smokeless tobacco: Not on file  . Alcohol Use: Not on file   Family History  Problem Relation Age of Onset  . Stroke Mother    Allergies  Allergen Reactions  . Codeine Nausea And Vomiting    Hallucinations   Current Outpatient Prescriptions on File Prior to Visit  Medication Sig Dispense Refill  . phentermine 30 MG capsule Take 1 capsule (30 mg total) by mouth every morning.  30 capsule  0   No current facility-administered medications on file prior to visit.   The PMH, PSH, Social History, Family History, Medications, and allergies have been reviewed in [facility], and have been updated if relevant.  OBJECTIVE: BP 104/70  Pulse 67  Temp(Src) 97.9 F (36.6 C)  Wt 232 lb (105.235 kg)  BMI 39.51 kg/m2  SpO2 97%  She appears well, vital signs are as noted. Ears normal.  Throat and pharynx normal.  Neck supple. No adenopathy in the neck. Nose is congested. Sinuses non tender. The chest is clear, without wheezes or rales.  ASSESSMENT:  viral upper respiratory illness  PLAN: Symptomatic therapy suggested: push fluids, rest and return office visit prn if symptoms persist or worsen. Lack of antibiotic effectiveness discussed with her. Call or return to clinic prn if these symptoms worsen or fail to improve as anticipated.

## 2012-10-16 ENCOUNTER — Telehealth: Payer: Self-pay

## 2012-10-16 MED ORDER — AMOXICILLIN-POT CLAVULANATE 875-125 MG PO TABS: 1.0000 | ORAL_TABLET | Freq: Two times a day (BID) | ORAL | Status: DC

## 2012-10-16 NOTE — Telephone Encounter (Signed)
Rx for Augmentin sent to pt's pharmacy.  Please continue supportive care and keep [place] updated with symptoms.

## 2012-10-16 NOTE — Telephone Encounter (Signed)
Pt was seen 10/09/12; pt feels worse, productive cough with green and yellow phlegm, blows nose with yellow green mucus. ? fever and no wheezing. Pt request med called [facility].Please advise.

## 2012-10-16 NOTE — Telephone Encounter (Signed)
Left message on voice mail advising patient. 

## 2012-10-26 ENCOUNTER — Other Ambulatory Visit: Payer: Self-pay | Admitting: Physician Assistant

## 2012-10-27 ENCOUNTER — Encounter: Payer: Self-pay | Admitting: Family Medicine

## 2012-11-03 ENCOUNTER — Ambulatory Visit: Payer: Managed Care, Other (non HMO) | Admitting: Family Medicine

## 2012-11-24 ENCOUNTER — Encounter: Payer: Self-pay | Admitting: Family Medicine

## 2013-02-12 ENCOUNTER — Encounter: Payer: Self-pay | Admitting: Family Medicine

## 2013-02-12 ENCOUNTER — Ambulatory Visit (INDEPENDENT_AMBULATORY_CARE_PROVIDER_SITE_OTHER): Payer: Managed Care, Other (non HMO) | Admitting: Family Medicine

## 2013-02-12 VITALS — BP 118/72 | HR 68 | Temp 98.6°F | Wt 234.2 lb

## 2013-02-12 DIAGNOSIS — H60399 Other infective otitis externa, unspecified ear: Secondary | ICD-10-CM

## 2013-02-12 DIAGNOSIS — H6502 Acute serous otitis media, left ear: Secondary | ICD-10-CM | POA: Insufficient documentation

## 2013-02-12 DIAGNOSIS — H6500 Acute serous otitis media, unspecified ear: Secondary | ICD-10-CM

## 2013-02-12 DIAGNOSIS — H6092 Unspecified otitis externa, left ear: Secondary | ICD-10-CM

## 2013-02-12 MED ORDER — AMOXICILLIN 875 MG PO TABS: 875.0000 mg | ORAL_TABLET | Freq: Two times a day (BID) | ORAL | Status: DC

## 2013-02-12 MED ORDER — HYDROCODONE-ACETAMINOPHEN 5-325 MG PO TABS: 1.0000 | ORAL_TABLET | Freq: Four times a day (QID) | ORAL | Status: DC | PRN

## 2013-02-12 MED ORDER — CIPROFLOXACIN-DEXAMETHASONE 0.3-0.1 % OT SUSP: 4.0000 [drp] | Freq: Two times a day (BID) | OTIC | Status: DC

## 2013-02-12 NOTE — Assessment & Plan Note (Signed)
Treat with ciprodex ear drops and hydrocodone for breakthrough pain after ibuprofen (pt states has tolerated vicodin in past) Pt agrees with plan.

## 2013-02-12 NOTE — Assessment & Plan Note (Signed)
Anticipate serous otitis, however if not improving as expected, may fill amoxicillin course. Pt agrees with plan.

## 2013-02-12 NOTE — Patient Instructions (Addendum)
You do have outer ear infection on the left, as well as possible developing middle ear infection. Treat with ciprodex ear drops as well as hydrocodone for pain if ibuprofen doesn't help. If worsening or not improving as expected, fill amoxicillin course. I hope you start feeling better, please let us know if you are not.  Otitis Externa Otitis externa is a bacterial or fungal infection of the outer ear canal. This is the area from the eardrum to the outside of the ear. Otitis externa is sometimes called "swimmer's ear." CAUSES  Possible causes of infection include:  Swimming in dirty water.  Moisture remaining in the ear after swimming or bathing.  Mild injury (trauma) to the ear.  Objects stuck in the ear (foreign body).  Cuts or scrapes (abrasions) on the outside of the ear. SYMPTOMS  The first symptom of infection is often itching in the ear canal. Later signs and symptoms may include swelling and redness of the ear canal, ear pain, and yellowish-white fluid (pus) coming from the ear. The ear pain may be worse when pulling on the earlobe. DIAGNOSIS  Your caregiver will perform a physical exam. A sample of fluid may be taken from the ear and examined for bacteria or fungi. TREATMENT  Antibiotic ear drops are often given for 10 to 14 days. Treatment may also include pain medicine or corticosteroids to reduce itching and swelling. PREVENTION   Keep your ear dry. Use the corner of a towel to absorb water out of the ear canal after swimming or bathing.  Avoid scratching or putting objects inside your ear. This can damage the ear canal or remove the protective wax that lines the canal. This makes it easier for bacteria and fungi to grow.  Avoid swimming in lakes, polluted water, or poorly chlorinated pools.  You may use ear drops made of rubbing alcohol and vinegar after swimming. Combine equal parts of white vinegar and alcohol in a bottle. Put 3 or 4 drops into each ear after  swimming. HOME CARE INSTRUCTIONS   Apply antibiotic ear drops to the ear canal as prescribed by your caregiver.  Only take over-the-counter or prescription medicines for pain, discomfort, or fever as directed by your caregiver.  If you have diabetes, follow any additional treatment instructions from your caregiver.  Keep all follow-up appointments as directed by your caregiver. SEEK MEDICAL CARE IF:   You have a fever.  Your ear is still red, swollen, painful, or draining pus after 3 days.  Your redness, swelling, or pain gets worse.  You have a severe headache.  You have redness, swelling, pain, or tenderness in the area behind your ear. MAKE SURE YOU:   Understand these instructions.  Will watch your condition.  Will get help right away if you are not doing well or get worse. Document Released: 08/16/2005 Document Revised: 11/08/2011 Document Reviewed: 09/02/2011 [facility] Patient Information 2014 [place], [place].

## 2013-02-12 NOTE — Progress Notes (Signed)
  Subjective:    Patient ID: Tara Tanner, female    DOB: October 06, 1965, 47 y.o.   MRN: [id number]  HPI CC: L earache  4d h/o L earache.  Prior to this had ST.  Some congestion over the weekend as well.  Very painful L ear - muffled hearing as well.  Trouble sleeping on left 2/2 earache.  + HA.  Generalized malaise.  No ear drainage.  No fevers, no sinus pain.  No decreased appetite, nausea/vomiting or diarrhea.  No PNdrainage.  No tinnitus.  Has tried ibuprofen for pain.  Today had to leave work 2/2 pain. No sick contacts at home.  No recent ear infections.  Swam a few weekends ago.  Past Medical History  Diagnosis Date  . Obesity      Review of Systems Per HPI    Objective:   Physical Exam  Nursing note and vitals reviewed. Constitutional: She appears well-developed and well-nourished. No distress.  HENT:  Head: Normocephalic and atraumatic.  Right Ear: Hearing, tympanic membrane, external ear and ear canal normal.  Left Ear: Hearing and external ear normal.  Nose: No mucosal edema or rhinorrhea. Right sinus exhibits no maxillary sinus tenderness and no frontal sinus tenderness. Left sinus exhibits no maxillary sinus tenderness and no frontal sinus tenderness.  Mouth/Throat: Uvula is midline, oropharynx is clear and moist and mucous membranes are normal. No oropharyngeal exudate.  L external ear canal erythematous and swollen. L TM dull yellow, without good light reflex and fluid behind TM.  No perf noted.  Eyes: Conjunctivae and EOM are normal. Pupils are equal, round, and reactive to light. No scleral icterus.  Neck: Normal range of motion. Neck supple.  Lymphadenopathy:    She has no cervical adenopathy.       Assessment & Plan:

## 2013-02-16 ENCOUNTER — Telehealth: Payer: Self-pay

## 2013-02-16 NOTE — Telephone Encounter (Signed)
Patient notified and will go to Saturday clinic as a walk-in.

## 2013-02-16 NOTE — Telephone Encounter (Signed)
Pt seen 02/12/13; pt started antibiotic on 02/14/13. Lt ear no better, now head congested, can't hear out of lt ear and ear drops make ear hurt for 45 mins after administered.No fever, no drainage or bleeding from ear. [facility]. Pt request cb.

## 2013-02-16 NOTE — Telephone Encounter (Signed)
plz schedule in Saturday clinic as no spots available today.

## 2013-09-06 ENCOUNTER — Encounter: Payer: Self-pay | Admitting: Family Medicine

## 2013-09-06 ENCOUNTER — Ambulatory Visit (INDEPENDENT_AMBULATORY_CARE_PROVIDER_SITE_OTHER): Payer: Managed Care, Other (non HMO) | Admitting: Family Medicine

## 2013-09-06 VITALS — BP 106/80 | HR 51 | Temp 98.3°F | Ht 64.25 in | Wt 214.2 lb

## 2013-09-06 DIAGNOSIS — J189 Pneumonia, unspecified organism: Secondary | ICD-10-CM

## 2013-09-06 DIAGNOSIS — B86 Scabies: Secondary | ICD-10-CM

## 2013-09-06 MED ORDER — AZITHROMYCIN 250 MG PO TABS: ORAL_TABLET | ORAL | Status: DC

## 2013-09-06 MED ORDER — PERMETHRIN 5 % EX CREA: 1.0000 "application " | TOPICAL_CREAM | Freq: Once | CUTANEOUS | Status: DC

## 2013-09-06 NOTE — Progress Notes (Signed)
Patient Name: Tara Tanner Date of Birth: 12-01-1965 Medical Record Number: [id number]  History of Present Illness:  Patent presents with runny nose, sneezing, cough, sore throat, malaise and minimal / low-grade fever X 3 weeks  Rash  Severe causing pain and severe itching, waking up at night, scratching to the point of being bloody. Recent travel 3 weeks ago. 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   PMH, PHS, Allergies, Problem List, Medications, Family History, and Social History have all been reviewed.  Patient Active Problem List   Diagnosis Date Noted  . External otitis of left ear 02/12/2013  . Acute serous otitis media of left ear 02/12/2013  . Obesity     Past Medical History  Diagnosis Date  . Obesity     Past Surgical History  Procedure Laterality Date  . Abdominal hysterectomy  06/2008    vag hys secondary to Uterine leiomyomata  . Myomectomy      History   Social History  . Marital Status: Married    Spouse Name: N/A    Number of Children: N/A  . Years of Education: N/A   Occupational History  . Not on file.   Social History Main Topics  . Smoking status: Never Smoker   . Smokeless tobacco: Never Used  . Alcohol Use: No  . Drug Use: No  . Sexual Activity: Not on file   Other Topics Concern  . 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   Married, 1 daughterUbaldo Glassing age 80    Family History  Problem Relation Age of Onset  . Stroke Mother     Allergies  Allergen Reactions  . Codeine Nausea And Vomiting    Hallucinations    Medication list reviewed and updated in full in [place].  Review of Systems: as above, eating and drinking - tolerating PO. Urinating normally. No excessive vomitting or diarrhea. O/w as above.  Physical Exam:  Filed Vitals:   09/06/13 1653  BP: 106/80  Pulse: 51  Temp: 98.3 F (36.8 C)  TempSrc: Oral    Height: 5' 4.25" (1.632 m)  Weight: 214 lb 4 oz (97.183 kg)    GEN: WDWN, Non-toxic, Atraumatic, normocephalic. A and O x 3. HEENT: Oropharynx clear without exudate, MMM, no significant LAD, mild rhinnorhea Ears: TM clear, COL visualized with good landmarks CV: RRR, no m/g/r. Pulm: CTA B, no wheezes, rhonchi, or crackles, normal respiratory effort. EXT: no c/c/e Psych: well oriented, neither depressed nor anxious in appearance SKIN: throughout arms, torso, legs. Abd, small dark lesions with evidence of extreme excoriation  Objective Data: Results for orders placed in visit on 09/22/12  COMPREHENSIVE METABOLIC PANEL      Result Value Range   Sodium 138  135 - 145 mEq/L   Potassium 4.0  3.5 - 5.1 mEq/L   Chloride 102  96 - 112 mEq/L   CO2 28  19 - 32 mEq/L   Glucose, Bld 88  70 - 99 mg/dL   BUN 15  6 - 23 mg/dL   Creatinine, Ser 0.8  0.4 - 1.2 mg/dL   Total Bilirubin 0.6  0.3 - 1.2 mg/dL   Alkaline Phosphatase 90  39 - 117 U/L   AST 20  0 - 37 U/L   ALT 25  0 - 35 U/L   Total Protein 8.2  6.0 - 8.3 g/dL  Albumin 4.5  3.5 - 5.2 g/dL   Calcium 10.1  8.4 - 10.5 mg/dL   GFR 81.41  >60.00 mL/min  LIPID PANEL      Result Value Range   Cholesterol 154  0 - 200 mg/dL   Triglycerides 82.0  0.0 - 149.0 mg/dL   HDL 63.10  >39.00 mg/dL   VLDL 16.4  0.0 - 40.0 mg/dL   LDL Cholesterol 75  0 - 99 mg/dL   Total CHOL/HDL Ratio 2      Scabies  Walking pneumonia  Worsening scabies, treat x 2 per inst. If not better in 3-4 weeks, reassess consider derm  Sx > 3 weeks, would treat for atypicals  Patient Instructions  Scabies Scabies are small bugs (mites) that burrow under the skin and cause red bumps and severe itching. These bugs can only be seen with a microscope. Scabies are highly contagious. They can spread easily from person to person by direct contact. They are also spread through sharing clothing or linens that have the scabies mites living in them. It is not unusual for an  entire family to become infected through shared towels, clothing, or bedding.  HOME CARE INSTRUCTIONS   Your caregiver may prescribe a cream or lotion to kill the mites. If cream is prescribed, massage the cream into the entire body from the neck to the bottom of both feet. Also massage the cream into the scalp and face if your child is less than 52 year old. Avoid the eyes and mouth. Do not wash your hands after application.  Leave the cream on for 8 to 12 hours. Your child should bathe or shower after the 8 to 12 hour application period. Sometimes it is helpful to apply the cream to your child right before bedtime.  One treatment is usually effective and will eliminate approximately 95% of infestations. For severe cases, your caregiver may decide to repeat the treatment in 1 week. Everyone in your household should be treated with one application of the cream.  New rashes or burrows should not appear within 24 to 48 hours after successful treatment. However, the itching and rash may last for 2 to 4 weeks after successful treatment. Your caregiver may prescribe a medicine to help with the itching or to help the rash go away more quickly.  Scabies can live on clothing or linens for up to 3 days. All of your child's recently used clothing, towels, stuffed toys, and bed linens should be washed in hot water and then dried in a dryer for at least 20 minutes on high heat. Items that cannot be washed should be enclosed in a plastic bag for at least 3 days.  To help relieve itching, bathe your child in a cool bath or apply cool washcloths to the affected areas.  Your child may return to school after treatment with the prescribed cream. SEEK MEDICAL CARE IF:   The itching persists longer than 4 weeks after treatment.  The rash spreads or becomes infected. Signs of infection include red blisters or yellow-tan crust. Document Released: 08/16/2005 Document Revised: 11/08/2011 Document Reviewed:  12/25/2008 [facility] Patient Information 2014 [place].    No orders of the defined types were placed in this encounter.    New medications, updates to list, dose adjustments: Meds ordered this encounter  Medications  . azithromycin (ZITHROMAX Z-PAK) 250 MG tablet    Sig: Take 2 tablets (500 mg) on  Day 1,  followed by 1 tablet (250 mg) once daily  on Days 2 through 5.    Dispense:  6 each    Refill:  0  . permethrin (ELIMITE) 5 % cream    Sig: Apply 1 application topically once. Leave on 12 hours. Repeat in 14 days    Dispense:  120 g    Refill:  0    Signed,  Delaynee Alred T. Ilamae Geng, MD, [place] [facility] [place] [address] Phone: [phone] Fax: [phone]    Medication List       This list is accurate as of: 09/06/13 11:59 PM.  Always use your most recent med list.               azithromycin 250 MG tablet  Commonly known as:  ZITHROMAX Z-PAK  Take 2 tablets (500 mg) on  Day 1,  followed by 1 tablet (250 mg) once daily on Days 2 through 5.     permethrin 5 % cream  Commonly known as:  ELIMITE  Apply 1 application topically once. Leave on 12 hours. Repeat in 14 days

## 2013-09-06 NOTE — Progress Notes (Signed)
Pre-visit discussion using our clinic review tool. No additional management support is needed unless otherwise documented below in the visit note.  

## 2013-09-06 NOTE — Patient Instructions (Signed)
Scabies  Scabies are small bugs (mites) that burrow under the skin and cause red bumps and severe itching. These bugs can only be seen with a microscope. Scabies are highly contagious. They can spread easily from person to person by direct contact. They are also spread through sharing clothing or linens that have the scabies mites living in them. It is not unusual for an entire family to become infected through shared towels, clothing, or bedding.   HOME CARE INSTRUCTIONS   · Your caregiver may prescribe a cream or lotion to kill the mites. If cream is prescribed, massage the cream into the entire body from the neck to the bottom of both feet. Also massage the cream into the scalp and face if your child is less than 1 year old. Avoid the eyes and mouth. Do not wash your hands after application.  · Leave the cream on for 8 to 12 hours. Your child should bathe or shower after the 8 to 12 hour application period. Sometimes it is helpful to apply the cream to your child right before bedtime.  · One treatment is usually effective and will eliminate approximately 95% of infestations. For severe cases, your caregiver may decide to repeat the treatment in 1 week. Everyone in your household should be treated with one application of the cream.  · New rashes or burrows should not appear within 24 to 48 hours after successful treatment. However, the itching and rash may last for 2 to 4 weeks after successful treatment. Your caregiver may prescribe a medicine to help with the itching or to help the rash go away more quickly.  · Scabies can live on clothing or linens for up to 3 days. All of your child's recently used clothing, towels, stuffed toys, and bed linens should be washed in hot water and then dried in a dryer for at least 20 minutes on high heat. Items that cannot be washed should be enclosed in a plastic bag for at least 3 days.  · To help relieve itching, bathe your child in a cool bath or apply cool washcloths to the  affected areas.  · Your child may return to school after treatment with the prescribed cream.  SEEK MEDICAL CARE IF:   · The itching persists longer than 4 weeks after treatment.  · The rash spreads or becomes infected. Signs of infection include red blisters or yellow-tan crust.  Document Released: 08/16/2005 Document Revised: 11/08/2011 Document Reviewed: 12/25/2008  [employer]® Patient Information ©2014 [employer].

## 2013-09-17 ENCOUNTER — Other Ambulatory Visit: Payer: Self-pay | Admitting: Family Medicine

## 2013-09-17 DIAGNOSIS — Z136 Encounter for screening for cardiovascular disorders: Secondary | ICD-10-CM

## 2013-09-17 DIAGNOSIS — Z0000 Encounter for general adult medical examination without abnormal findings: Secondary | ICD-10-CM

## 2013-09-18 ENCOUNTER — Other Ambulatory Visit: Payer: Managed Care, Other (non HMO)

## 2013-10-01 ENCOUNTER — Other Ambulatory Visit: Payer: Managed Care, Other (non HMO)

## 2013-10-05 ENCOUNTER — Other Ambulatory Visit: Payer: Managed Care, Other (non HMO)

## 2013-10-05 ENCOUNTER — Other Ambulatory Visit (INDEPENDENT_AMBULATORY_CARE_PROVIDER_SITE_OTHER): Payer: Managed Care, Other (non HMO)

## 2013-10-05 DIAGNOSIS — Z0000 Encounter for general adult medical examination without abnormal findings: Secondary | ICD-10-CM

## 2013-10-05 DIAGNOSIS — Z136 Encounter for screening for cardiovascular disorders: Secondary | ICD-10-CM

## 2013-10-05 LAB — CBC WITH DIFFERENTIAL/PLATELET
Basophils Absolute: 0 10*3/uL (ref 0.0–0.1)
Basophils Relative: 0.4 % (ref 0.0–3.0)
Eosinophils Absolute: 0.3 10*3/uL (ref 0.0–0.7)
Eosinophils Relative: 2.9 % (ref 0.0–5.0)
HCT: 42.8 % (ref 36.0–46.0)
Hemoglobin: 14.1 g/dL (ref 12.0–15.0)
Lymphocytes Relative: 28 % (ref 12.0–46.0)
Lymphs Abs: 2.6 10*3/uL (ref 0.7–4.0)
MCHC: 33 g/dL (ref 30.0–36.0)
MCV: 87.9 fl (ref 78.0–100.0)
Monocytes Absolute: 0.5 10*3/uL (ref 0.1–1.0)
Monocytes Relative: 4.9 % (ref 3.0–12.0)
Neutro Abs: 6 10*3/uL (ref 1.4–7.7)
Neutrophils Relative %: 63.8 % (ref 43.0–77.0)
Platelets: 397 10*3/uL (ref 150.0–400.0)
RBC: 4.87 Mil/uL (ref 3.87–5.11)
RDW: 14.1 % (ref 11.5–14.6)
WBC: 9.4 10*3/uL (ref 4.5–10.5)

## 2013-10-08 LAB — LIPID PANEL
Cholesterol: 147 mg/dL (ref 0–200)
HDL: 72.8 mg/dL (ref >39.00)
LDL Cholesterol: 66 mg/dL (ref 0–99)
Total CHOL/HDL Ratio: 2
Triglycerides: 41 mg/dL (ref 0.0–149.0)
VLDL: 8.2 mg/dL (ref 0.0–40.0)

## 2013-10-08 LAB — COMPREHENSIVE METABOLIC PANEL
ALT: 19 U/L (ref 0–35)
AST: 17 U/L (ref 0–37)
Albumin: 4.1 g/dL (ref 3.5–5.2)
Alkaline Phosphatase: 80 U/L (ref 39–117)
BUN: 15 mg/dL (ref 6–23)
CO2: 25 mEq/L (ref 19–32)
Calcium: 9.8 mg/dL (ref 8.4–10.5)
Chloride: 104 mEq/L (ref 96–112)
Creatinine, Ser: 0.8 mg/dL (ref 0.4–1.2)
GFR: 82.24 mL/min (ref >60.00)
Glucose, Bld: 82 mg/dL (ref 70–99)
Potassium: 4.4 mEq/L (ref 3.5–5.1)
Sodium: 138 mEq/L (ref 135–145)
Total Bilirubin: 0.5 mg/dL (ref 0.3–1.2)
Total Protein: 7.5 g/dL (ref 6.0–8.3)

## 2013-10-08 LAB — TSH: TSH: 0.47 u[IU]/mL (ref 0.35–5.50)

## 2013-10-09 ENCOUNTER — Encounter: Payer: Managed Care, Other (non HMO) | Admitting: Internal Medicine

## 2013-10-12 ENCOUNTER — Encounter: Payer: Self-pay | Admitting: Internal Medicine

## 2013-10-12 ENCOUNTER — Ambulatory Visit (INDEPENDENT_AMBULATORY_CARE_PROVIDER_SITE_OTHER): Payer: Managed Care, Other (non HMO) | Admitting: Internal Medicine

## 2013-10-12 VITALS — BP 110/72 | HR 56 | Temp 98.3°F | Ht 64.0 in | Wt 205.0 lb

## 2013-10-12 DIAGNOSIS — Z0000 Encounter for general adult medical examination without abnormal findings: Secondary | ICD-10-CM

## 2013-10-12 NOTE — Progress Notes (Signed)
Subjective:    Patient ID: Tara Tanner, female    DOB: 1966/08/26, 48 y.o.   MRN: [id number]  HPI  Pt presents to the clinic today for her annual physical. She has no concerns today.  Flu: never Tetanus: up to date, unsure of date Pap Smear: Dr. Gertie Fey, 2012 Mammogram: 2013, scheduled 09/2013 Eye Doctor: yearly Dentist: biannually  Review of Systems      Past Medical History  Diagnosis Date  . Obesity     Current Outpatient Prescriptions  Medication Sig Dispense Refill  . Multiple Vitamins-Minerals (MULTIVITAMIN PO) Take 1 capsule by mouth.       No current facility-administered medications for this visit.    Allergies  Allergen Reactions  . Codeine Nausea And Vomiting    Hallucinations    Family History  Problem Relation Age of Onset  . Stroke Mother     History   Social History  . Marital Status: Married    Spouse Name: N/A    Number of Children: N/A  . Years of Education: N/A   Occupational History  . Not on file.   Social History Main Topics  . Smoking status: Never Smoker   . Smokeless tobacco: Never Used  . Alcohol Use: No  . Drug Use: No  . Sexual Activity: Not on file   Other Topics Concern  . 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   Married, 1 daughterUbaldo Glassing age 5     Constitutional: Denies fever, malaise, fatigue, headache or abrupt weight changes.  HEENT: Denies eye pain, eye redness, ear pain, ringing in the ears, wax buildup, runny nose, nasal congestion, bloody nose, or sore throat. Respiratory: Denies difficulty breathing, shortness of breath, cough or sputum production.   Cardiovascular: Denies chest pain, chest tightness, palpitations or swelling in the hands or feet.  Gastrointestinal: Denies abdominal pain, bloating, constipation, diarrhea or blood in the stool.  GU: Denies urgency, frequency, pain with urination, burning sensation, blood in urine, odor or  discharge. Musculoskeletal: Denies decrease in range of motion, difficulty with gait, muscle pain or joint pain and swelling.  Skin: Denies redness, rashes, lesions or ulcercations.  Neurological: Denies dizziness, difficulty with memory, difficulty with speech or problems with balance and coordination.   No other specific complaints in a complete review of systems (except as listed in HPI above).  Objective:   Physical Exam   BP 110/72  Pulse 56  Temp(Src) 98.3 F (36.8 C) (Oral)  Ht 5\' 4"  (1.626 m)  Wt 205 lb (92.987 kg)  BMI 35.17 kg/m2  SpO2 98% Wt Readings from Last 3 Encounters:  10/12/13 205 lb (92.987 kg)  09/06/13 214 lb 4 oz (97.183 kg)  02/12/13 234 lb 4 oz (106.255 kg)    General: Appears her stated age, overweight but well developed, well nourished in NAD. Skin: Warm, dry and intact. No rashes, lesions or ulcerations noted. HEENT: Head: normal shape and size; Eyes: sclera white, no icterus, conjunctiva pink, PERRLA and EOMs intact; Ears: Tm's gray and intact, normal light reflex; Nose: mucosa pink and moist, septum midline; Throat/Mouth: Teeth present, mucosa pink and moist, no exudate, lesions or ulcerations noted.  Neck: Normal range of motion. Neck supple, trachea midline. No massses, lumps or thyromegaly present.  Cardiovascular: Normal rate and rhythm. S1,S2 noted.  No murmur, rubs or gallops noted. No JVD or BLE edema. No carotid bruits noted. Pulmonary/Chest: Normal effort and positive vesicular breath  sounds. No respiratory distress. No wheezes, rales or ronchi noted.  Abdomen: Soft and nontender. Normal bowel sounds, no bruits noted. No distention or masses noted. Liver, spleen and kidneys non palpable. Musculoskeletal: Normal range of motion. No signs of joint swelling. No difficulty with gait.  Neurological: Alert and oriented. Cranial nerves II-XII intact. Coordination normal. +DTRs bilaterally. Psychiatric: Mood and affect normal. Behavior is normal.  Judgment and thought content normal.     BMET    Component Value Date/Time   NA 138 10/05/2013 1224   K 4.4 10/05/2013 1224   CL 104 10/05/2013 1224   CO2 25 10/05/2013 1224   GLUCOSE 82 10/05/2013 1224   BUN 15 10/05/2013 1224   CREATININE 0.8 10/05/2013 1224   CALCIUM 9.8 10/05/2013 1224    Lipid Panel     Component Value Date/Time   CHOL 147 10/05/2013 1224   TRIG 41.0 10/05/2013 1224   HDL 72.80 10/05/2013 1224   CHOLHDL 2 10/05/2013 1224   VLDL 8.2 10/05/2013 1224   LDLCALC 66 10/05/2013 1224    CBC    Component Value Date/Time   WBC 9.4 10/05/2013 1224   RBC 4.87 10/05/2013 1224   HGB 14.1 10/05/2013 1224   HCT 42.8 10/05/2013 1224   PLT 397.0 10/05/2013 1224   MCV 87.9 10/05/2013 1224   MCHC 33.0 10/05/2013 1224   RDW 14.1 10/05/2013 1224   LYMPHSABS 2.6 10/05/2013 1224   MONOABS 0.5 10/05/2013 1224   EOSABS 0.3 10/05/2013 1224   BASOSABS 0.0 10/05/2013 1224    Hgb A1C No results found for this basename: HGBA1C        Assessment & Plan:   Preventative Health Maintenance:  Encouraged her to work on her diet and exercise Labs reviewed- all normal She declines flu vaccine today  RTC in 1 year or sooner if needed

## 2013-10-12 NOTE — Patient Instructions (Addendum)

## 2013-10-12 NOTE — Progress Notes (Signed)
Pre-visit discussion using our clinic review tool. No additional management support is needed unless otherwise documented below in the visit note.  

## 2014-04-09 ENCOUNTER — Encounter: Payer: Self-pay | Admitting: Family Medicine

## 2014-04-15 ENCOUNTER — Ambulatory Visit (INDEPENDENT_AMBULATORY_CARE_PROVIDER_SITE_OTHER): Payer: Managed Care, Other (non HMO) | Admitting: Family Medicine

## 2014-04-15 ENCOUNTER — Encounter: Payer: Self-pay | Admitting: Family Medicine

## 2014-04-15 VITALS — BP 112/62 | HR 50 | Temp 98.5°F | Ht 63.75 in | Wt 226.5 lb

## 2014-04-15 DIAGNOSIS — R599 Enlarged lymph nodes, unspecified: Secondary | ICD-10-CM

## 2014-04-15 DIAGNOSIS — L259 Unspecified contact dermatitis, unspecified cause: Secondary | ICD-10-CM

## 2014-04-15 MED ORDER — HYDROXYZINE PAMOATE 25 MG PO CAPS: 25.0000 mg | ORAL_CAPSULE | Freq: Three times a day (TID) | ORAL | Status: DC | PRN

## 2014-04-15 NOTE — Assessment & Plan Note (Signed)
New- likely due to contact dermatitis but will check labs today as well to rule out systemic issue. The patient indicates understanding of these issues and agrees with the plan.  Orders Placed This Encounter  Procedures  . CBC with Differential  . Comprehensive metabolic panel  . Sedimentation Rate  . C-reactive protein  . Ambulatory referral to Allergy

## 2014-04-15 NOTE — Progress Notes (Signed)
Pre visit review using our clinic review tool, if applicable. No additional management support is needed unless otherwise documented below in the visit note. 

## 2014-04-15 NOTE — Assessment & Plan Note (Signed)
Persistent. Has not yet tried vistaril- eRx sent for itching. Refer to allergist for further work up.

## 2014-04-15 NOTE — Patient Instructions (Signed)
Great to see you. Please stop by to see Tara Tanner on your way out to set up your referral to an allergist.

## 2014-04-15 NOTE — Progress Notes (Signed)
   Subjective:   Patient ID: Tara Tanner, female    DOB: [birth year], 48 y.o.   MRN: [id number]  Tara Tanner is a pleasant 48 y.o. year old female who presents to clinic today with Rash  on 04/15/2014  HPI: One year of unrelenting symptoms of pruritis and "full body rash."  Has been seeing dermatologist at [place] this year.  Biopsy proven contact dermatitis.  Has been on and off prednisone, topical steroids, and antihistamines.  Nothing has helped.   Does not know what the trigger is.  Had mammogram last week and was told two axillary lymph nodes on left side swollen- not pathologically- per radiologist, likely due to reactivity from contact dermatitis.  Had patch test done at dermatology office but has not been to an allergist yet.  No wheezing, no angioedema.  Has never had anything like this prior.  Current Outpatient Prescriptions on File Prior to Visit  Medication Sig Dispense Refill  . Multiple Vitamins-Minerals (MULTIVITAMIN PO) Take 1 capsule by mouth.       No current facility-administered medications on file prior to visit.    Allergies  Allergen Reactions  . Codeine Nausea And Vomiting    Hallucinations    Past Medical History  Diagnosis Date  . Obesity     Past Surgical History  Procedure Laterality Date  . Abdominal hysterectomy  06/2008    vag hys secondary to Uterine leiomyomata  . Myomectomy      Family History  Problem Relation Age of Onset  . Stroke Mother     History   Social History  . Marital Status: Married    Spouse Name: N/A    Number of Children: N/A  . Years of Education: N/A   Occupational History  . Not on file.   Social History Main Topics  . Smoking status: Never Smoker   . Smokeless tobacco: Never Used  . Alcohol Use: No  . Drug Use: No  . Sexual Activity: Not on file   Other Topics Concern  . 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   Married, 1 daughter-  [name] age 24   The PMH, PSH, Social History, Family History, Medications, and allergies have been reviewed in [facility], and have been updated if relevant.   Review of Systems See HPI    Objective:    BP 112/62  Pulse 50  Temp(Src) 98.5 F (36.9 C) (Oral)  Ht 5' 3.75" (1.619 m)  Wt 226 lb 8 oz (102.74 kg)  BMI 39.20 kg/m2  SpO2 98%   Physical Exam  Gen: alert, pleasant, NAD Skin:  Multiple sites of exoriation on abdomen and neck with urticaria throughout      Assessment & Plan:   Contact dermatitis - Plan: Ambulatory referral to Allergy  Enlarged lymph node - Plan: CBC with Differential, Comprehensive metabolic panel, Sedimentation Rate, C-reactive protein No Follow-up on file.

## 2014-04-16 LAB — COMPREHENSIVE METABOLIC PANEL
ALK PHOS: 89 U/L (ref 39–117)
ALT: 21 U/L (ref 0–35)
AST: 21 U/L (ref 0–37)
Albumin: 4.2 g/dL (ref 3.5–5.2)
BUN: 14 mg/dL (ref 6–23)
CO2: 29 mEq/L (ref 19–32)
CREATININE: 0.8 mg/dL (ref 0.4–1.2)
Calcium: 10.3 mg/dL (ref 8.4–10.5)
Chloride: 103 mEq/L (ref 96–112)
GFR: 85.82 mL/min (ref >60.00)
Glucose, Bld: 75 mg/dL (ref 70–99)
Potassium: 4.1 mEq/L (ref 3.5–5.1)
Sodium: 140 mEq/L (ref 135–145)
Total Bilirubin: 0.6 mg/dL (ref 0.2–1.2)
Total Protein: 8 g/dL (ref 6.0–8.3)

## 2014-04-16 LAB — SEDIMENTATION RATE: SED RATE: 9 mm/h (ref 0–22)

## 2014-04-16 LAB — C-REACTIVE PROTEIN: CRP: 0.6 mg/dL (ref 0.5–20.0)

## 2014-04-17 ENCOUNTER — Telehealth: Payer: Self-pay

## 2014-04-17 NOTE — Telephone Encounter (Signed)
Pt called re; to lab results from 04/15/14; advised pt not all testing has resulted yet; advised pt from result note Labs reassuring. Awaiting cbc results. Pt said she would wait for complete results call back.

## 2014-04-19 NOTE — Telephone Encounter (Signed)
Spoke to pt and informed her of results. Pt states that she will RTO in appx 1hr to have labs redrawn

## 2014-04-19 NOTE — Telephone Encounter (Signed)
Thank you :)

## 2014-04-19 NOTE — Addendum Note (Signed)
Addended by: Lurlean Nanny on: 04/19/2014 11:51 AM   Modules accepted: Orders

## 2014-04-19 NOTE — Telephone Encounter (Signed)
Pt left v/m requesting cb with test results to [phone].

## 2014-04-19 NOTE — Telephone Encounter (Signed)
Everything looked good unfortunately CBC not drawn and that is why it has not resulted. Can she come in today to have this done?

## 2014-04-20 LAB — CBC WITH DIFFERENTIAL/PLATELET
Basophils Absolute: 0 10*3/uL (ref 0.0–0.1)
Basophils Relative: 0.5 % (ref 0.0–3.0)
Eosinophils Absolute: 0.4 10*3/uL (ref 0.0–0.7)
Eosinophils Relative: 4.9 % (ref 0.0–5.0)
HCT: 43.2 % (ref 36.0–46.0)
Hemoglobin: 14.5 g/dL (ref 12.0–15.0)
Lymphocytes Relative: 32.5 % (ref 12.0–46.0)
Lymphs Abs: 2.4 10*3/uL (ref 0.7–4.0)
MCHC: 33.5 g/dL (ref 30.0–36.0)
MCV: 86.4 fl (ref 78.0–100.0)
MONOS PCT: 6.2 % (ref 3.0–12.0)
Monocytes Absolute: 0.5 10*3/uL (ref 0.1–1.0)
NEUTROS ABS: 4.2 10*3/uL (ref 1.4–7.7)
Neutrophils Relative %: 55.9 % (ref 43.0–77.0)
Platelets: 372 10*3/uL (ref 150.0–400.0)
RBC: 5 Mil/uL (ref 3.87–5.11)
RDW: 13.2 % (ref 11.5–15.5)
WBC: 7.5 10*3/uL (ref 4.0–10.5)

## 2014-06-29 ENCOUNTER — Other Ambulatory Visit: Payer: Self-pay | Admitting: Family Medicine

## 2014-10-09 ENCOUNTER — Other Ambulatory Visit: Payer: Self-pay | Admitting: Family Medicine

## 2014-10-09 DIAGNOSIS — Z0000 Encounter for general adult medical examination without abnormal findings: Secondary | ICD-10-CM

## 2014-10-11 ENCOUNTER — Other Ambulatory Visit (INDEPENDENT_AMBULATORY_CARE_PROVIDER_SITE_OTHER): Payer: Managed Care, Other (non HMO)

## 2014-10-11 DIAGNOSIS — Z0000 Encounter for general adult medical examination without abnormal findings: Secondary | ICD-10-CM

## 2014-10-11 LAB — COMPREHENSIVE METABOLIC PANEL
ALBUMIN: 4.2 g/dL (ref 3.5–5.2)
ALK PHOS: 97 U/L (ref 39–117)
ALT: 16 U/L (ref 0–35)
AST: 18 U/L (ref 0–37)
BUN: 13 mg/dL (ref 6–23)
CALCIUM: 9.9 mg/dL (ref 8.4–10.5)
CHLORIDE: 104 meq/L (ref 96–112)
CO2: 30 mEq/L (ref 19–32)
Creatinine, Ser: 0.71 mg/dL (ref 0.40–1.20)
GFR: 92.64 mL/min (ref >60.00)
Glucose, Bld: 82 mg/dL (ref 70–99)
POTASSIUM: 3.9 meq/L (ref 3.5–5.1)
SODIUM: 138 meq/L (ref 135–145)
TOTAL PROTEIN: 7.4 g/dL (ref 6.0–8.3)
Total Bilirubin: 0.7 mg/dL (ref 0.2–1.2)

## 2014-10-11 LAB — LIPID PANEL
CHOL/HDL RATIO: 2
Cholesterol: 137 mg/dL (ref 0–200)
HDL: 64.3 mg/dL (ref >39.00)
LDL CALC: 59 mg/dL (ref 0–99)
NonHDL: 72.7
TRIGLYCERIDES: 70 mg/dL (ref 0.0–149.0)
VLDL: 14 mg/dL (ref 0.0–40.0)

## 2014-10-11 LAB — TSH: TSH: 0.63 u[IU]/mL (ref 0.35–4.50)

## 2014-10-11 LAB — CBC WITH DIFFERENTIAL/PLATELET
BASOS ABS: 0.1 10*3/uL (ref 0.0–0.1)
Basophils Relative: 0.8 % (ref 0.0–3.0)
EOS ABS: 0.3 10*3/uL (ref 0.0–0.7)
Eosinophils Relative: 3.5 % (ref 0.0–5.0)
HCT: 40.1 % (ref 36.0–46.0)
HEMOGLOBIN: 13.9 g/dL (ref 12.0–15.0)
Lymphocytes Relative: 30.6 % (ref 12.0–46.0)
Lymphs Abs: 2.4 10*3/uL (ref 0.7–4.0)
MCHC: 34.6 g/dL (ref 30.0–36.0)
MCV: 84.4 fl (ref 78.0–100.0)
Monocytes Absolute: 0.5 10*3/uL (ref 0.1–1.0)
Monocytes Relative: 6.9 % (ref 3.0–12.0)
NEUTROS ABS: 4.6 10*3/uL (ref 1.4–7.7)
NEUTROS PCT: 58.2 % (ref 43.0–77.0)
Platelets: 359 10*3/uL (ref 150.0–400.0)
RBC: 4.75 Mil/uL (ref 3.87–5.11)
RDW: 13.2 % (ref 11.5–15.5)
WBC: 7.9 10*3/uL (ref 4.0–10.5)

## 2014-10-17 ENCOUNTER — Encounter: Payer: Managed Care, Other (non HMO) | Admitting: Family Medicine

## 2014-10-23 ENCOUNTER — Ambulatory Visit (INDEPENDENT_AMBULATORY_CARE_PROVIDER_SITE_OTHER): Payer: Managed Care, Other (non HMO) | Admitting: Family Medicine

## 2014-10-23 ENCOUNTER — Encounter: Payer: Self-pay | Admitting: Family Medicine

## 2014-10-23 VITALS — BP 114/60 | HR 52 | Temp 98.0°F | Ht 64.0 in | Wt 238.5 lb

## 2014-10-23 DIAGNOSIS — Z01419 Encounter for gynecological examination (general) (routine) without abnormal findings: Secondary | ICD-10-CM | POA: Insufficient documentation

## 2014-10-23 DIAGNOSIS — Z1211 Encounter for screening for malignant neoplasm of colon: Secondary | ICD-10-CM

## 2014-10-23 DIAGNOSIS — E669 Obesity, unspecified: Secondary | ICD-10-CM

## 2014-10-23 NOTE — Progress Notes (Signed)
Subjective:    Patient ID: Tara Tanner, female    DOB: Aug 26, 1966, 49 y.o.   MRN: [id number]  HPI  49 yo here pleasant female here for CPX.  Mammogram 04/06/14 Remote h/o hysterectomy but still has ovaries.  She will be turning 49 next month.  Obesity- deteriorated but she had a rough year.  Father in law recently died and she had the rash that no one could figure out last year. It has since resolved.  She plans to get back on track now.      Lab Results  Component Value Date   CHOL 137 10/11/2014   HDL 64.30 10/11/2014   LDLCALC 59 10/11/2014   TRIG 70.0 10/11/2014   CHOLHDL 2 10/11/2014   Lab Results  Component Value Date   WBC 7.9 10/11/2014   HGB 13.9 10/11/2014   HCT 40.1 10/11/2014   MCV 84.4 10/11/2014   PLT 359.0 10/11/2014   Lab Results  Component Value Date   TSH 0.63 10/11/2014   Lab Results  Component Value Date   NA 138 10/11/2014   K 3.9 10/11/2014   CL 104 10/11/2014   CO2 30 10/11/2014   Lab Results  Component Value Date   CREATININE 0.71 10/11/2014      Patient Active Problem List   Diagnosis Date Noted  . Well woman exam with routine gynecological exam 10/23/2014  . Obesity    Past Medical History  Diagnosis Date  . Obesity    Past Surgical History  Procedure Laterality Date  . Abdominal hysterectomy  06/2008    vag hys secondary to Uterine leiomyomata  . Myomectomy     History  Substance Use Topics  . Smoking status: Never Smoker   . Smokeless tobacco: Never Used  . Alcohol Use: No   Family History  Problem Relation Age of Onset  . Stroke Mother    Allergies  Allergen Reactions  . Codeine Nausea And Vomiting    Hallucinations    The PMH, PSH, Social History, Family History, Medications, and allergies have been reviewed in [facility], and have been updated if relevant.     Review of Systems See HPI    Patient reports no  vision/ hearing changes,anorexia, weight change, fever ,adenopathy, persistant / recurrent  hoarseness, swallowing issues, chest pain, edema,persistant / recurrent cough, hemoptysis, dyspnea(rest, exertional, paroxysmal nocturnal), gastrointestinal  bleeding (melena, rectal bleeding), abdominal pain, excessive heart burn, GU symptoms(dysuria, hematuria, pyuria, voiding/incontinence  Issues) syncope, focal weakness, severe memory loss, concerning skin lesions, depression, anxiety, abnormal bruising/bleeding, major joint swelling, breast masses or abnormal vaginal bleeding.    Objective:   Physical Exam BP 114/60 mmHg  Pulse 52  Temp(Src) 98 F (36.7 C) (Oral)  Ht 5\' 4"  (1.626 m)  Wt 238 lb 8 oz (108.183 kg)  BMI 40.92 kg/m2  SpO2 97%  Wt Readings from Last 3 Encounters:  10/23/14 238 lb 8 oz (108.183 kg)  04/15/14 226 lb 8 oz (102.74 kg)  10/12/13 205 lb (92.987 kg)     General:  Well-developed,well-nourished,in no acute distress; alert,appropriate and cooperative throughout examination Head:  normocephalic and atraumatic.   Eyes:  vision grossly intact, pupils equal, pupils round, and pupils reactive to light.   Ears:  R ear normal and L ear normal.   Nose:  no external deformity.   Mouth:  good dentition.   Neck:  No deformities, masses, or tenderness noted. Breasts:  No mass, nodules, thickening, tenderness, bulging, retraction, inflamation, nipple discharge or skin changes  noted.   Lungs:  Normal respiratory effort, chest expands symmetrically. Lungs are clear to auscultation, no crackles or wheezes. Heart:  Normal rate and regular rhythm. S1 and S2 normal without gallop, murmur, click, rub or other extra sounds. Abdomen:  Bowel sounds positive,abdomen soft and non-tender without masses, organomegaly or hernias noted. Rectal:  no external abnormalities.   Genitalia:  Pelvic Exam:        External: normal female genitalia without lesions or masses        Vagina: normal without lesions or masses        Cervix: absent        Adnexa: normal bimanual exam without masses or  fullness        Uterus: absent         Msk:  No deformity or scoliosis noted of thoracic or lumbar spine.   Extremities:  No clubbing, cyanosis, edema, or deformity noted with normal full range of motion of all joints.   Neurologic:  alert & oriented X3 and gait normal.   Skin:  Intact without suspicious lesions or rashes Cervical Nodes:  No lymphadenopathy noted Axillary Nodes:  No palpable lymphadenopathy Psych:  Cognition and judgment appear intact. Alert and cooperative with normal attention span and concentration. No apparent delusions, illusions, hallucinations

## 2014-10-23 NOTE — Progress Notes (Signed)
Pre visit review using our clinic review tool, if applicable. No additional management support is needed unless otherwise documented below in the visit note. 

## 2014-10-23 NOTE — Assessment & Plan Note (Addendum)
Reviewed preventive care protocols, scheduled due services, and updated immunizations Discussed nutrition, exercise, diet, and healthy lifestyle.  GI referral placed for screening colonoscopy after she turns 49 next month.  BME done today.

## 2014-10-23 NOTE — Patient Instructions (Signed)
Good to see you. We will call you with your GI appointment after April 2016.

## 2014-12-10 ENCOUNTER — Ambulatory Visit (INDEPENDENT_AMBULATORY_CARE_PROVIDER_SITE_OTHER): Payer: Managed Care, Other (non HMO) | Admitting: Family Medicine

## 2014-12-10 ENCOUNTER — Encounter: Payer: Self-pay | Admitting: Family Medicine

## 2014-12-10 VITALS — BP 114/72 | HR 59 | Temp 97.9°F | Wt 242.5 lb

## 2014-12-10 DIAGNOSIS — J069 Acute upper respiratory infection, unspecified: Secondary | ICD-10-CM

## 2014-12-10 MED ORDER — BENZONATATE 200 MG PO CAPS: 200.0000 mg | ORAL_CAPSULE | Freq: Two times a day (BID) | ORAL | Status: DC | PRN

## 2014-12-10 NOTE — Progress Notes (Signed)
SUBJECTIVE:  Tara Tanner is a 49 y.o. female who complains of coryza, congestion, sneezing, dry cough and myalgias for 4 days. She denies a history of anorexia, shortness of breath, vomiting and sputum production and denies a history of asthma. Patient denies smoke cigarettes.   Current Outpatient Prescriptions on File Prior to Visit  Medication Sig Dispense Refill  . Multiple Vitamins-Minerals (MULTIVITAMIN PO) Take 1 capsule by mouth.     No current facility-administered medications on file prior to visit.    Allergies  Allergen Reactions  . Codeine Nausea And Vomiting    Hallucinations    Past Medical History  Diagnosis Date  . Obesity     Past Surgical History  Procedure Laterality Date  . Abdominal hysterectomy  06/2008    vag hys secondary to Uterine leiomyomata  . Myomectomy      Family History  Problem Relation Age of Onset  . Stroke Mother     History   Social History  . Marital Status: Married    Spouse Name: N/A  . Number of Children: N/A  . Years of Education: N/A   Occupational History  . Not on file.   Social History Main Topics  . Smoking status: Never Smoker   . Smokeless tobacco: Never Used  . Alcohol Use: No  . Drug Use: No  . Sexual Activity: Not on file   Other Topics Concern  . 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   Married, 1 daughter- [name] age 53   The PMH, PSH, Social History, Family History, Medications, and allergies have been reviewed in [facility], and have been updated if relevant.  OBJECTIVE: BP 114/72 mmHg  Pulse 59  Temp(Src) 97.9 F (36.6 C) (Oral)  Wt 242 lb 8 oz (109.997 kg)  SpO2 97%  She appears well, vital signs are as noted. Ears normal.  Throat and pharynx normal.  Neck supple. No adenopathy in the neck. Nose is congested. Sinuses non tender. The chest is clear, without wheezes or rales.  ASSESSMENT:  viral upper respiratory illness  PLAN: Symptomatic  therapy suggested: push fluids, rest and return office visit prn if symptoms persist or worsen.  Tessalon eRx sent- codeine allergic. Lack of antibiotic effectiveness discussed with her. Call or return to clinic prn if these symptoms worsen or fail to improve as anticipated.

## 2014-12-10 NOTE — Progress Notes (Signed)
Pre visit review using our clinic review tool, if applicable. No additional management support is needed unless otherwise documented below in the visit note. 

## 2014-12-10 NOTE — Patient Instructions (Signed)
Good to see you. Start taking an antihistamine like zyrtec or allegra or claritin.  If you want something that has a decongestant, then ask for one of those with D behind it.  Also you can start flonase over the counter.  Tessalon as needed for cough.

## 2015-09-22 ENCOUNTER — Encounter: Payer: Self-pay | Admitting: Primary Care

## 2015-09-22 ENCOUNTER — Ambulatory Visit (INDEPENDENT_AMBULATORY_CARE_PROVIDER_SITE_OTHER): Payer: Managed Care, Other (non HMO) | Admitting: Primary Care

## 2015-09-22 VITALS — BP 116/70 | HR 55 | Temp 97.9°F | Ht 64.0 in | Wt 231.4 lb

## 2015-09-22 DIAGNOSIS — J069 Acute upper respiratory infection, unspecified: Secondary | ICD-10-CM

## 2015-09-22 NOTE — Progress Notes (Signed)
   Subjective:    Patient ID: Tara Tanner, female    DOB: Jan 02, 1966, 50 y.o.   MRN: [id number]  HPI  Tara Tanner is a 50 year old female who presents today with a chief complaint of cough. She also reports sore throat, nasal congestion, ear pain, headache, chills. Her cough is productive with yellow sptum. Her symptoms have been present since Friday last week. She's taken Mucinex, alka-selzer cold and sinus and ibuprofen, without improvement. Denies any sick contacts.   Review of Systems  Constitutional: Positive for chills. Negative for fever.  HENT: Positive for congestion, ear pain and sore throat.   Respiratory: Positive for cough. Negative for shortness of breath.   Cardiovascular: Negative for chest pain.  Neurological: Positive for headaches.       Past Medical History  Diagnosis Date  . Obesity     Social History   Social History  . Marital Status: Married    Spouse Name: N/A  . Number of Children: N/A  . Years of Education: N/A   Occupational History  . Not on file.   Social History Main Topics  . Smoking status: Never Smoker   . Smokeless tobacco: Never Used  . Alcohol Use: No  . Drug Use: No  . Sexual Activity: Not on file   Other Topics Concern  . 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   Married, 1 daughterUbaldo Tanner age 19    Past Surgical History  Procedure Laterality Date  . Abdominal hysterectomy  06/2008    vag hys secondary to Uterine leiomyomata  . Myomectomy      Family History  Problem Relation Age of Onset  . Stroke Mother     Allergies  Allergen Reactions  . Codeine Nausea And Vomiting    Hallucinations    Current Outpatient Prescriptions on File Prior to Visit  Medication Sig Dispense Refill  . Multiple Vitamins-Minerals (MULTIVITAMIN PO) Take 1 capsule by mouth.     No current facility-administered medications on file prior to visit.    BP 116/70 mmHg  Pulse 55  Temp(Src)  97.9 F (36.6 C) (Oral)  Ht 5\' 4"  (1.626 m)  Wt 231 lb 6.4 oz (104.962 kg)  BMI 39.70 kg/m2  SpO2 95%    Objective:   Physical Exam  Constitutional: She appears well-nourished.  HENT:  Right Ear: Tympanic membrane and ear canal normal.  Left Ear: Tympanic membrane and ear canal normal.  Nose: Right sinus exhibits maxillary sinus tenderness. Right sinus exhibits no frontal sinus tenderness. Left sinus exhibits maxillary sinus tenderness. Left sinus exhibits no frontal sinus tenderness.  Mouth/Throat: Oropharynx is clear and moist.  Cardiovascular: Normal rate and regular rhythm.   Pulmonary/Chest: Effort normal and breath sounds normal. She has no wheezes. She has no rales.  Skin: Skin is warm and dry.          Assessment & Plan:  URI:  Cough, nasal congestion, sore throat, etc x 4 days. Temporary improvement with OTC's. Exam with clear lungs which is reassuring, sinus tenderness, mucosal edema to nasal cavity. Exam consistent with viral URI. Will treat with supportive measures: Robitussin, Zyrtec HS, Flonase. Return precautions provided. Reassurance provided that this should resolve on its own.

## 2015-09-22 NOTE — Progress Notes (Signed)
Pre visit review using our clinic review tool, if applicable. No additional management support is needed unless otherwise documented below in the visit note. 

## 2015-09-22 NOTE — Patient Instructions (Signed)
Nasal Congestion: Try using Flonase (fluticasone) nasal spray. Instill 2 sprays in each nostril once daily.   Cough: Robitussin DM or Delsym. Nyquil at night.  Throat drainage: Zyrtec at bedtime.   Increase consumption of water intake and rest.  It was a pleasure meeting you!  Upper Respiratory Infection, Adult Most upper respiratory infections (URIs) are a viral infection of the air passages leading to the lungs. A URI affects the nose, throat, and upper air passages. The most common type of URI is nasopharyngitis and is typically referred to as "the common cold." URIs run their course and usually go away on their own. Most of the time, a URI does not require medical attention, but sometimes a bacterial infection in the upper airways can follow a viral infection. This is called a secondary infection. Sinus and middle ear infections are common types of secondary upper respiratory infections. Bacterial pneumonia can also complicate a URI. A URI can worsen asthma and chronic obstructive pulmonary disease (COPD). Sometimes, these complications can require emergency medical care and may be life threatening.  CAUSES Almost all URIs are caused by viruses. A virus is a type of germ and can spread from one person to another.  RISKS FACTORS You may be at risk for a URI if:   You smoke.   You have chronic heart or lung disease.  You have a weakened defense (immune) system.   You are very young or very old.   You have nasal allergies or asthma.  You work in crowded or poorly ventilated areas.  You work in health care facilities or schools. SIGNS AND SYMPTOMS  Symptoms typically develop 2-3 days after you come in contact with a cold virus. Most viral URIs last 7-10 days. However, viral URIs from the influenza virus (flu virus) can last 14-18 days and are typically more severe. Symptoms may include:   Runny or stuffy (congested) nose.   Sneezing.   Cough.   Sore throat.    Headache.   Fatigue.   Fever.   Loss of appetite.   Pain in your forehead, behind your eyes, and over your cheekbones (sinus pain).  Muscle aches.  DIAGNOSIS  Your health care provider may diagnose a URI by:  Physical exam.  Tests to check that your symptoms are not due to another condition such as:  Strep throat.  Sinusitis.  Pneumonia.  Asthma. TREATMENT  A URI goes away on its own with time. It cannot be cured with medicines, but medicines may be prescribed or recommended to relieve symptoms. Medicines may help:  Reduce your fever.  Reduce your cough.  Relieve nasal congestion. HOME CARE INSTRUCTIONS   Take medicines only as directed by your health care provider.   Gargle warm saltwater or take cough drops to comfort your throat as directed by your health care provider.  Use a warm mist humidifier or inhale steam from a shower to increase air moisture. This may make it easier to breathe.  Drink enough fluid to keep your urine clear or pale yellow.   Eat soups and other clear broths and maintain good nutrition.   Rest as needed.   Return to work when your temperature has returned to normal or as your health care provider advises. You may need to stay home longer to avoid infecting others. You can also use a face mask and careful hand washing to prevent spread of the virus.  Increase the usage of your inhaler if you have asthma.   Do not  use any tobacco products, including cigarettes, chewing tobacco, or electronic cigarettes. If you need help quitting, ask your health care provider. PREVENTION  The best way to protect yourself from getting a cold is to practice good hygiene.   Avoid oral or hand contact with people with cold symptoms.   Wash your hands often if contact occurs.  There is no clear evidence that vitamin C, vitamin E, echinacea, or exercise reduces the chance of developing a cold. However, it is always recommended to get plenty  of rest, exercise, and practice good nutrition.  SEEK MEDICAL CARE IF:   You are getting worse rather than better.   Your symptoms are not controlled by medicine.   You have chills.  You have worsening shortness of breath.  You have brown or red mucus.  You have yellow or brown nasal discharge.  You have pain in your face, especially when you bend forward.  You have a fever.  You have swollen neck glands.  You have pain while swallowing.  You have white areas in the back of your throat. SEEK IMMEDIATE MEDICAL CARE IF:   You have severe or persistent:  Headache.  Ear pain.  Sinus pain.  Chest pain.  You have chronic lung disease and any of the following:  Wheezing.  Prolonged cough.  Coughing up blood.  A change in your usual mucus.  You have a stiff neck.  You have changes in your:  Vision.  Hearing.  Thinking.  Mood. MAKE SURE YOU:   Understand these instructions.  Will watch your condition.  Will get help right away if you are not doing well or get worse.   This information is not intended to replace advice given to you by your health care provider. Make sure you discuss any questions you have with your health care provider.   Document Released: 02/09/2001 Document Revised: 12/31/2014 Document Reviewed: 11/21/2013 Elsevier Interactive Patient Education [employer].

## 2015-10-17 ENCOUNTER — Other Ambulatory Visit: Payer: Self-pay | Admitting: Family Medicine

## 2015-10-17 DIAGNOSIS — Z0000 Encounter for general adult medical examination without abnormal findings: Secondary | ICD-10-CM | POA: Insufficient documentation

## 2015-10-17 DIAGNOSIS — Z01419 Encounter for gynecological examination (general) (routine) without abnormal findings: Secondary | ICD-10-CM

## 2015-10-24 ENCOUNTER — Other Ambulatory Visit (INDEPENDENT_AMBULATORY_CARE_PROVIDER_SITE_OTHER): Payer: Managed Care, Other (non HMO)

## 2015-10-24 DIAGNOSIS — Z0000 Encounter for general adult medical examination without abnormal findings: Secondary | ICD-10-CM

## 2015-10-24 DIAGNOSIS — Z01419 Encounter for gynecological examination (general) (routine) without abnormal findings: Secondary | ICD-10-CM

## 2015-10-24 LAB — COMPREHENSIVE METABOLIC PANEL
ALBUMIN: 4.4 g/dL (ref 3.5–5.2)
ALK PHOS: 84 U/L (ref 39–117)
ALT: 20 U/L (ref 0–35)
AST: 20 U/L (ref 0–37)
BILIRUBIN TOTAL: 0.6 mg/dL (ref 0.2–1.2)
BUN: 15 mg/dL (ref 6–23)
CO2: 27 mEq/L (ref 19–32)
CREATININE: 0.76 mg/dL (ref 0.40–1.20)
Calcium: 10 mg/dL (ref 8.4–10.5)
Chloride: 104 mEq/L (ref 96–112)
GFR: 85.29 mL/min (ref >60.00)
Glucose, Bld: 78 mg/dL (ref 70–99)
POTASSIUM: 3.8 meq/L (ref 3.5–5.1)
SODIUM: 137 meq/L (ref 135–145)
TOTAL PROTEIN: 7.5 g/dL (ref 6.0–8.3)

## 2015-10-24 LAB — CBC WITH DIFFERENTIAL/PLATELET
Basophils Absolute: 0 10*3/uL (ref 0.0–0.1)
Basophils Relative: 0.4 % (ref 0.0–3.0)
EOS PCT: 2.8 % (ref 0.0–5.0)
Eosinophils Absolute: 0.2 10*3/uL (ref 0.0–0.7)
HCT: 38 % (ref 36.0–46.0)
HEMOGLOBIN: 12.9 g/dL (ref 12.0–15.0)
Lymphocytes Relative: 30.2 % (ref 12.0–46.0)
Lymphs Abs: 2.6 10*3/uL (ref 0.7–4.0)
MCHC: 34 g/dL (ref 30.0–36.0)
MCV: 84.4 fl (ref 78.0–100.0)
MONO ABS: 0.6 10*3/uL (ref 0.1–1.0)
MONOS PCT: 6.8 % (ref 3.0–12.0)
Neutro Abs: 5.1 10*3/uL (ref 1.4–7.7)
Neutrophils Relative %: 59.8 % (ref 43.0–77.0)
Platelets: 392 10*3/uL (ref 150.0–400.0)
RBC: 4.51 Mil/uL (ref 3.87–5.11)
RDW: 13.7 % (ref 11.5–15.5)
WBC: 8.6 10*3/uL (ref 4.0–10.5)

## 2015-10-24 LAB — LIPID PANEL
CHOLESTEROL: 117 mg/dL (ref 0–200)
HDL: 58.6 mg/dL (ref >39.00)
LDL Cholesterol: 48 mg/dL (ref 0–99)
NonHDL: 58.23
Total CHOL/HDL Ratio: 2
Triglycerides: 52 mg/dL (ref 0.0–149.0)
VLDL: 10.4 mg/dL (ref 0.0–40.0)

## 2015-10-24 LAB — TSH: TSH: 0.24 u[IU]/mL — AB (ref 0.35–4.50)

## 2015-10-28 ENCOUNTER — Encounter: Payer: Self-pay | Admitting: Family Medicine

## 2015-10-28 ENCOUNTER — Ambulatory Visit (INDEPENDENT_AMBULATORY_CARE_PROVIDER_SITE_OTHER): Payer: Managed Care, Other (non HMO) | Admitting: Family Medicine

## 2015-10-28 VITALS — BP 114/62 | HR 82 | Temp 98.2°F | Ht 63.75 in | Wt 229.2 lb

## 2015-10-28 DIAGNOSIS — Z1239 Encounter for other screening for malignant neoplasm of breast: Secondary | ICD-10-CM | POA: Diagnosis not present

## 2015-10-28 DIAGNOSIS — E669 Obesity, unspecified: Secondary | ICD-10-CM | POA: Diagnosis not present

## 2015-10-28 DIAGNOSIS — R946 Abnormal results of thyroid function studies: Secondary | ICD-10-CM

## 2015-10-28 DIAGNOSIS — Z01419 Encounter for gynecological examination (general) (routine) without abnormal findings: Secondary | ICD-10-CM

## 2015-10-28 DIAGNOSIS — R7989 Other specified abnormal findings of blood chemistry: Secondary | ICD-10-CM | POA: Insufficient documentation

## 2015-10-28 DIAGNOSIS — Z0000 Encounter for general adult medical examination without abnormal findings: Secondary | ICD-10-CM

## 2015-10-28 DIAGNOSIS — Z1211 Encounter for screening for malignant neoplasm of colon: Secondary | ICD-10-CM

## 2015-10-28 LAB — TSH: TSH: 0.58 u[IU]/mL (ref 0.35–4.50)

## 2015-10-28 LAB — T3, FREE: T3, Free: 3.4 pg/mL (ref 2.3–4.2)

## 2015-10-28 LAB — T4, FREE: Free T4: 1.17 ng/dL (ref 0.60–1.60)

## 2015-10-28 NOTE — Patient Instructions (Signed)
Great to see you. We will call you with your lab results.  Please stop by to see Rosaria Ferries on your way out to schedule your GI appointment.   Please do go ahead and have your mammogram done.  Happy birthday!

## 2015-10-28 NOTE — Assessment & Plan Note (Signed)
Reviewed preventive care protocols, scheduled due services, and updated immunizations Discussed nutrition, exercise, diet, and healthy lifestyle.  Agrees to colonoscopy- referral placed. Mammogram order placed as well- pt to schedule.

## 2015-10-28 NOTE — Assessment & Plan Note (Signed)
Improving with exercise 

## 2015-10-28 NOTE — Progress Notes (Signed)
Pre visit review using our clinic review tool, if applicable. No additional management support is needed unless otherwise documented below in the visit note. 

## 2015-10-28 NOTE — Progress Notes (Signed)
Subjective:    Patient ID: Tara Tanner, female    DOB: 1966-04-20, 50 y.o.   MRN: [id number]  HPI  50 yo here pleasant female here for CPX.  Mammogram 04/06/14 Remote h/o hysterectomy but still has ovaries.  Has never had a colonoscopy.   Wt Readings from Last 3 Encounters:  10/28/15 229 lb 4 oz (103.987 kg)  09/22/15 231 lb 6.4 oz (104.962 kg)  12/10/14 242 lb 8 oz (109.997 kg)   TSH was low. She denies any symptoms of hyperthyroidism.  Intentionally losing weight.  Attending zumba three days per week.   Lab Results  Component Value Date   CHOL 117 10/24/2015   HDL 58.60 10/24/2015   LDLCALC 48 10/24/2015   TRIG 52.0 10/24/2015   CHOLHDL 2 10/24/2015   Lab Results  Component Value Date   WBC 8.6 10/24/2015   HGB 12.9 10/24/2015   HCT 38.0 10/24/2015   MCV 84.4 10/24/2015   PLT 392.0 10/24/2015   Lab Results  Component Value Date   TSH 0.24* 10/24/2015   Lab Results  Component Value Date   NA 137 10/24/2015   K 3.8 10/24/2015   CL 104 10/24/2015   CO2 27 10/24/2015   Lab Results  Component Value Date   CREATININE 0.76 10/24/2015      Patient Active Problem List   Diagnosis Date Noted  . Well woman exam 10/17/2015  . Obesity    Past Medical History  Diagnosis Date  . Obesity    Past Surgical History  Procedure Laterality Date  . Abdominal hysterectomy  06/2008    vag hys secondary to Uterine leiomyomata  . Myomectomy     Social History  Substance Use Topics  . Smoking status: Never Smoker   . Smokeless tobacco: Never Used  . Alcohol Use: No   Family History  Problem Relation Age of Onset  . Stroke Mother    Allergies  Allergen Reactions  . Codeine Nausea And Vomiting    Hallucinations    The PMH, PSH, Social History, Family History, Medications, and allergies have been reviewed in [facility], and have been updated if relevant.    Review of Systems  Constitutional: Negative.   HENT: Negative.   Respiratory: Negative.    Cardiovascular: Negative.   Gastrointestinal: Negative.   Endocrine: Negative.   Genitourinary: Negative.   Musculoskeletal: Negative.   Skin: Negative.   Allergic/Immunologic: Negative.   Neurological: Negative.   Hematological: Negative.   Psychiatric/Behavioral: Negative.   All other systems reviewed and are negative.     Objective:   Physical Exam BP 114/62 mmHg  Pulse 82  Temp(Src) 98.2 F (36.8 C) (Oral)  Ht 5' 3.75" (1.619 m)  Wt 229 lb 4 oz (103.987 kg)  BMI 39.67 kg/m2  SpO2 97%  Wt Readings from Last 3 Encounters:  10/28/15 229 lb 4 oz (103.987 kg)  09/22/15 231 lb 6.4 oz (104.962 kg)  12/10/14 242 lb 8 oz (109.997 kg)     General:  Well-developed,well-nourished,in no acute distress; alert,appropriate and cooperative throughout examination Head:  normocephalic and atraumatic.   Eyes:  vision grossly intact, pupils equal, pupils round, and pupils reactive to light.   Ears:  R ear normal and L ear normal.   Nose:  no external deformity.   Mouth:  good dentition.   Neck:  No deformities, masses, or tenderness noted. Breasts:  No mass, nodules, thickening, tenderness, bulging, retraction, inflamation, nipple discharge or skin changes noted.   Lungs:  Normal  respiratory effort, chest expands symmetrically. Lungs are clear to auscultation, no crackles or wheezes. Heart:  Normal rate and regular rhythm. S1 and S2 normal without gallop, murmur, click, rub or other extra sounds. Abdomen:  Bowel sounds positive,abdomen soft and non-tender without masses, organomegaly or hernias noted. Msk:  No deformity or scoliosis noted of thoracic or lumbar spine.   Extremities:  No clubbing, cyanosis, edema, or deformity noted with normal full range of motion of all joints.   Neurologic:  alert & oriented X3 and gait normal.   Skin:  Intact without suspicious lesions or rashes Cervical Nodes:  No lymphadenopathy noted Axillary Nodes:  No palpable lymphadenopathy Psych:   Cognition and judgment appear intact. Alert and cooperative with normal attention span and concentration. No apparent delusions, illusions, hallucinations

## 2015-10-28 NOTE — Assessment & Plan Note (Signed)
New- complete thyroid panel today for further evaluation.

## 2015-10-29 ENCOUNTER — Encounter: Payer: Self-pay | Admitting: Family Medicine

## 2015-11-28 ENCOUNTER — Encounter: Payer: Self-pay | Admitting: Gastroenterology

## 2016-02-27 ENCOUNTER — Encounter: Payer: Managed Care, Other (non HMO) | Admitting: Gastroenterology

## 2016-09-21 ENCOUNTER — Telehealth: Payer: Self-pay | Admitting: Family Medicine

## 2016-09-21 NOTE — Telephone Encounter (Signed)
Patient Name: Tara Tanner DOB: [birth year] Initial Comment Caller states she is having vomiting and diarrhea and she may have a fever. Nurse Assessment Nurse: Ronnald Ramp, RN, Miranda Date/Time (Eastern Time): 09/21/2016 12:23:51 PM Confirm and document reason for call. If symptomatic, describe symptoms. ---Caller states she is having vomiting and diarrhea since 2am (last episode was 2.5 hrs ago). She thinks she has a fever, but does not have a thermometer. Does the patient have any new or worsening symptoms? ---Yes Will a triage be completed? ---Yes Related visit to physician within the last 2 weeks? ---No Does the PT have any chronic conditions? (i.e. diabetes, asthma, etc.) ---No Is the patient pregnant or possibly pregnant? (Ask all females between the ages of 91-55) ---No Is this a behavioral health or substance abuse call? ---No Guidelines Guideline Title Affirmed Question Affirmed Notes Vomiting [1] SEVERE vomiting (e.g., 6 or more times/day, vomits everything) BUT [2] hydrated (all triage questions negative) Final Disposition User [place], RN, Miranda Disagree/Comply: Leta Baptist

## 2016-09-21 NOTE — Telephone Encounter (Signed)
The team health note is not in portal yet to get specific home instructions given.

## 2016-10-21 ENCOUNTER — Other Ambulatory Visit: Payer: Self-pay | Admitting: Family Medicine

## 2016-10-21 DIAGNOSIS — Z01419 Encounter for gynecological examination (general) (routine) without abnormal findings: Secondary | ICD-10-CM

## 2016-10-21 DIAGNOSIS — R7989 Other specified abnormal findings of blood chemistry: Secondary | ICD-10-CM

## 2016-10-22 ENCOUNTER — Other Ambulatory Visit: Payer: Managed Care, Other (non HMO)

## 2016-11-01 ENCOUNTER — Encounter: Payer: Managed Care, Other (non HMO) | Admitting: Family Medicine

## 2016-12-09 ENCOUNTER — Ambulatory Visit (INDEPENDENT_AMBULATORY_CARE_PROVIDER_SITE_OTHER): Payer: BLUE CROSS/BLUE SHIELD | Admitting: Primary Care

## 2016-12-09 ENCOUNTER — Encounter: Payer: Self-pay | Admitting: Primary Care

## 2016-12-09 ENCOUNTER — Encounter (INDEPENDENT_AMBULATORY_CARE_PROVIDER_SITE_OTHER): Payer: Self-pay

## 2016-12-09 VITALS — BP 124/74 | HR 80 | Temp 98.1°F | Ht 63.75 in | Wt 235.1 lb

## 2016-12-09 DIAGNOSIS — J069 Acute upper respiratory infection, unspecified: Secondary | ICD-10-CM | POA: Diagnosis not present

## 2016-12-09 DIAGNOSIS — J029 Acute pharyngitis, unspecified: Secondary | ICD-10-CM

## 2016-12-09 LAB — POCT RAPID STREP A (OFFICE): Rapid Strep A Screen: NEGATIVE

## 2016-12-09 MED ORDER — HYDROCODONE-HOMATROPINE 5-1.5 MG/5ML PO SYRP: 5.0000 mL | ORAL_SOLUTION | Freq: Every evening | ORAL | 0 refills | Status: DC | PRN

## 2016-12-09 NOTE — Addendum Note (Signed)
Addended by: Jacqualin Combes on: 12/09/2016 11:57 AM   Modules accepted: Orders

## 2016-12-09 NOTE — Progress Notes (Signed)
Pre visit review using our clinic review tool, if applicable. No additional management support is needed unless otherwise documented below in the visit note. 

## 2016-12-09 NOTE — Progress Notes (Signed)
Subjective:    Patient ID: Tara Tanner, female    DOB: 1966-03-21, 51 y.o.   MRN: [id number]  HPI  Tara Tanner is a 51 year old female who presents today with a chief complaint of sore throat. She also reports chills, body aches, cough, nasal congestion, headache. Her symptoms began two days ago. She's taken Aleve, Alka-Seltzer Cold and Sinus without improvement. She recently returned from vacation and was riding on an airplane. She's also been around her daughter who had a bad cold.  Review of Systems  Constitutional: Positive for chills, fatigue and fever.  HENT: Positive for congestion and sore throat. Negative for ear pain and sinus pressure.   Respiratory: Positive for cough. Negative for shortness of breath.   Cardiovascular: Negative for chest pain.       Past Medical History:  Diagnosis Date  . Obesity      Social History   Social History  . Marital status: Married    Spouse name: N/A  . Number of children: N/A  . Years of education: N/A   Occupational History  . Not on file.   Social History Main Topics  . Smoking status: Never Smoker  . Smokeless tobacco: Never Used  . Alcohol use No  . Drug use: No  . Sexual activity: Not on file   Other Topics Concern  . 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   Married, 1 daughterUbaldo Glassing age 78    Past Surgical History:  Procedure Laterality Date  . ABDOMINAL HYSTERECTOMY  06/2008   vag hys secondary to Uterine leiomyomata  . MYOMECTOMY      Family History  Problem Relation Age of Onset  . Stroke Mother     Allergies  Allergen Reactions  . Codeine Nausea And Vomiting and Other (See Comments)    Causes vomiting Hallucinations    Current Outpatient Prescriptions on File Prior to Visit  Medication Sig Dispense Refill  . Multiple Vitamins-Minerals (MULTIVITAMIN PO) Take 1 capsule by mouth.     No current facility-administered medications on file prior to  visit.     BP 124/74   Pulse 80   Temp 98.1 F (36.7 C) (Oral)   Ht 5' 3.75" (1.619 m)   Wt 235 lb 1.9 oz (106.6 kg)   SpO2 98%   BMI 40.68 kg/m    Objective:   Physical Exam  Constitutional: She appears well-nourished. She appears ill.  HENT:  Right Ear: Tympanic membrane and ear canal normal.  Left Ear: Tympanic membrane and ear canal normal.  Nose: Right sinus exhibits no maxillary sinus tenderness and no frontal sinus tenderness. Left sinus exhibits no maxillary sinus tenderness and no frontal sinus tenderness.  Mouth/Throat: Posterior oropharyngeal erythema present.  Eyes: Conjunctivae are normal.  Neck: Neck supple.  Cardiovascular: Normal rate and regular rhythm.   Pulmonary/Chest: Effort normal and breath sounds normal. She has no wheezes. She has no rales.  Lymphadenopathy:    She has no cervical adenopathy.  Skin: Skin is warm and dry.          Assessment & Plan:  URI:  Sore throat, cough, nasal congestion, body aches x 2 days. Recently traveling on an airplane. No improvement with OTC meds. Exam today with clear lungs, moderate erythema to posterior pharynx, does appear acutely ill, vitals normal. Rapid Strep: Negative. Suspect viral URI and will treat with supportive measures. Try tylenol for body aches/chills/fevers, discussed Flonase  and Mucinex if needed. Rx printed for Hycodan HS. Fluids, rest, follow up PRN.  Tara Flow, NP

## 2016-12-09 NOTE — Patient Instructions (Signed)
Your symptoms are representative of a viral illness which will resolve on its own over time. Our goal is to treat your symptoms in order to aid your body in the healing process and to make you more comfortable.   Nasal Congestion/Ear Pressure: Try using Flonase (fluticasone) nasal spray. Instill 1 spray in each nostril twice daily.   Cough/Congestion: Try taking Mucinex DM. This will help loosen up the mucous in your chest. Ensure you take this medication with a full glass of water.  Switch from Ibuprofen to Tylenol for body aches and fevers.  You may take the Hycodan cough suppressant at bedtime as needed for cough and rest. Caution this medication contains codeine and will make you feel drowsy.  It was a pleasure meeting you!   Upper Respiratory Infection, Adult Most upper respiratory infections (URIs) are a viral infection of the air passages leading to the lungs. A URI affects the nose, throat, and upper air passages. The most common type of URI is nasopharyngitis and is typically referred to as "the common cold." URIs run their course and usually go away on their own. Most of the time, a URI does not require medical attention, but sometimes a bacterial infection in the upper airways can follow a viral infection. This is called a secondary infection. Sinus and middle ear infections are common types of secondary upper respiratory infections. Bacterial pneumonia can also complicate a URI. A URI can worsen asthma and chronic obstructive pulmonary disease (COPD). Sometimes, these complications can require emergency medical care and may be life threatening. What are the causes? Almost all URIs are caused by viruses. A virus is a type of germ and can spread from one person to another. What increases the risk? You may be at risk for a URI if:  You smoke.  You have chronic heart or lung disease.  You have a weakened defense (immune) system.  You are very young or very old.  You have nasal  allergies or asthma.  You work in crowded or poorly ventilated areas.  You work in health care facilities or schools. What are the signs or symptoms? Symptoms typically develop 2-3 days after you come in contact with a cold virus. Most viral URIs last 7-10 days. However, viral URIs from the influenza virus (flu virus) can last 14-18 days and are typically more severe. Symptoms may include:  Runny or stuffy (congested) nose.  Sneezing.  Cough.  Sore throat.  Headache.  Fatigue.  Fever.  Loss of appetite.  Pain in your forehead, behind your eyes, and over your cheekbones (sinus pain).  Muscle aches. How is this diagnosed? Your health care provider may diagnose a URI by:  Physical exam.  Tests to check that your symptoms are not due to another condition such as:  Strep throat.  Sinusitis.  Pneumonia.  Asthma. How is this treated? A URI goes away on its own with time. It cannot be cured with medicines, but medicines may be prescribed or recommended to relieve symptoms. Medicines may help:  Reduce your fever.  Reduce your cough.  Relieve nasal congestion. Follow these instructions at home:  Take medicines only as directed by your health care provider.  Gargle warm saltwater or take cough drops to comfort your throat as directed by your health care provider.  Use a warm mist humidifier or inhale steam from a shower to increase air moisture. This may make it easier to breathe.  Drink enough fluid to keep your urine clear or pale yellow.  Eat soups and other clear broths and maintain good nutrition.  Rest as needed.  Return to work when your temperature has returned to normal or as your health care provider advises. You may need to stay home longer to avoid infecting others. You can also use a face mask and careful hand washing to prevent spread of the virus.  Increase the usage of your inhaler if you have asthma.  Do not use any tobacco products, including  cigarettes, chewing tobacco, or electronic cigarettes. If you need help quitting, ask your health care provider. How is this prevented? The best way to protect yourself from getting a cold is to practice good hygiene.  Avoid oral or hand contact with people with cold symptoms.  Wash your hands often if contact occurs. There is no clear evidence that vitamin C, vitamin E, echinacea, or exercise reduces the chance of developing a cold. However, it is always recommended to get plenty of rest, exercise, and practice good nutrition. Contact a health care provider if:  You are getting worse rather than better.  Your symptoms are not controlled by medicine.  You have chills.  You have worsening shortness of breath.  You have brown or red mucus.  You have yellow or brown nasal discharge.  You have pain in your face, especially when you bend forward.  You have a fever.  You have swollen neck glands.  You have pain while swallowing.  You have white areas in the back of your throat. Get help right away if:  You have severe or persistent:  Headache.  Ear pain.  Sinus pain.  Chest pain.  You have chronic lung disease and any of the following:  Wheezing.  Prolonged cough.  Coughing up blood.  A change in your usual mucus.  You have a stiff neck.  You have changes in your:  Vision.  Hearing.  Thinking.  Mood. This information is not intended to replace advice given to you by your health care provider. Make sure you discuss any questions you have with your health care provider. Document Released: 02/09/2001 Document Revised: 04/18/2016 Document Reviewed: 11/21/2013 Elsevier Interactive Patient Education  2017 [employer].

## 2016-12-10 ENCOUNTER — Other Ambulatory Visit (INDEPENDENT_AMBULATORY_CARE_PROVIDER_SITE_OTHER): Payer: BLUE CROSS/BLUE SHIELD

## 2016-12-10 DIAGNOSIS — R946 Abnormal results of thyroid function studies: Secondary | ICD-10-CM | POA: Diagnosis not present

## 2016-12-10 DIAGNOSIS — R7989 Other specified abnormal findings of blood chemistry: Secondary | ICD-10-CM

## 2016-12-10 DIAGNOSIS — Z01419 Encounter for gynecological examination (general) (routine) without abnormal findings: Secondary | ICD-10-CM

## 2016-12-10 LAB — LIPID PANEL
Cholesterol: 141 mg/dL (ref 0–200)
HDL: 66.4 mg/dL (ref >39.00)
LDL Cholesterol: 60 mg/dL (ref 0–99)
NONHDL: 74.22
Total CHOL/HDL Ratio: 2
Triglycerides: 71 mg/dL (ref 0.0–149.0)
VLDL: 14.2 mg/dL (ref 0.0–40.0)

## 2016-12-10 LAB — COMPREHENSIVE METABOLIC PANEL
ALK PHOS: 96 U/L (ref 39–117)
ALT: 26 U/L (ref 0–35)
AST: 22 U/L (ref 0–37)
Albumin: 4.4 g/dL (ref 3.5–5.2)
BILIRUBIN TOTAL: 0.8 mg/dL (ref 0.2–1.2)
BUN: 13 mg/dL (ref 6–23)
CALCIUM: 10.1 mg/dL (ref 8.4–10.5)
CO2: 31 mEq/L (ref 19–32)
CREATININE: 0.76 mg/dL (ref 0.40–1.20)
Chloride: 101 mEq/L (ref 96–112)
GFR: 84.91 mL/min (ref >60.00)
GLUCOSE: 94 mg/dL (ref 70–99)
Potassium: 4.3 mEq/L (ref 3.5–5.1)
Sodium: 137 mEq/L (ref 135–145)
TOTAL PROTEIN: 7.7 g/dL (ref 6.0–8.3)

## 2016-12-10 LAB — CBC WITH DIFFERENTIAL/PLATELET
BASOS ABS: 0.1 10*3/uL (ref 0.0–0.1)
Basophils Relative: 0.9 % (ref 0.0–3.0)
EOS ABS: 0.4 10*3/uL (ref 0.0–0.7)
Eosinophils Relative: 3.8 % (ref 0.0–5.0)
HEMATOCRIT: 40.7 % (ref 36.0–46.0)
Hemoglobin: 13.8 g/dL (ref 12.0–15.0)
Lymphocytes Relative: 23.6 % (ref 12.0–46.0)
Lymphs Abs: 2.3 10*3/uL (ref 0.7–4.0)
MCHC: 33.8 g/dL (ref 30.0–36.0)
MCV: 85.7 fl (ref 78.0–100.0)
MONOS PCT: 7.4 % (ref 3.0–12.0)
Monocytes Absolute: 0.7 10*3/uL (ref 0.1–1.0)
NEUTROS PCT: 64.3 % (ref 43.0–77.0)
Neutro Abs: 6.3 10*3/uL (ref 1.4–7.7)
Platelets: 350 10*3/uL (ref 150.0–400.0)
RBC: 4.74 Mil/uL (ref 3.87–5.11)
RDW: 13.5 % (ref 11.5–15.5)
WBC: 9.7 10*3/uL (ref 4.0–10.5)

## 2016-12-10 LAB — TSH: TSH: 0.38 u[IU]/mL (ref 0.35–4.50)

## 2016-12-20 ENCOUNTER — Encounter: Payer: Self-pay | Admitting: Family Medicine

## 2016-12-20 ENCOUNTER — Ambulatory Visit (INDEPENDENT_AMBULATORY_CARE_PROVIDER_SITE_OTHER): Payer: BLUE CROSS/BLUE SHIELD | Admitting: Family Medicine

## 2016-12-20 VITALS — BP 132/84 | HR 57 | Temp 98.0°F | Ht 64.0 in | Wt 233.0 lb

## 2016-12-20 DIAGNOSIS — Z1211 Encounter for screening for malignant neoplasm of colon: Secondary | ICD-10-CM | POA: Diagnosis not present

## 2016-12-20 DIAGNOSIS — Z01419 Encounter for gynecological examination (general) (routine) without abnormal findings: Secondary | ICD-10-CM | POA: Diagnosis not present

## 2016-12-20 DIAGNOSIS — E6609 Other obesity due to excess calories: Secondary | ICD-10-CM | POA: Diagnosis not present

## 2016-12-20 NOTE — Progress Notes (Signed)
Subjective:   Patient ID: Tara Tanner, female    DOB: 08-Sep-1965, 51 y.o.   MRN: [id number]  Tara Tanner is a pleasant 51 y.o. year old female who presents to clinic today with Annual Exam  on 12/20/2016  HPI:  51 yo here pleasant female here for CPX.  Mammogram 10/29/2015 Remote h/o hysterectomy but still has ovaries.  Has never had a colonoscopy.  Lab Results  Component Value Date   CHOL 141 12/10/2016   HDL 66.40 12/10/2016   LDLCALC 60 12/10/2016   TRIG 71.0 12/10/2016   CHOLHDL 2 12/10/2016   Lab Results  Component Value Date   NA 137 12/10/2016   K 4.3 12/10/2016   CL 101 12/10/2016   CO2 31 12/10/2016   Lab Results  Component Value Date   ALT 26 12/10/2016   AST 22 12/10/2016   ALKPHOS 96 12/10/2016   BILITOT 0.8 12/10/2016   Lab Results  Component Value Date   WBC 9.7 12/10/2016   HGB 13.8 12/10/2016   HCT 40.7 12/10/2016   MCV 85.7 12/10/2016   PLT 350.0 12/10/2016   Lab Results  Component Value Date   TSH 0.38 12/10/2016     Current Outpatient Prescriptions on File Prior to Visit  Medication Sig Dispense Refill  . HYDROcodone-homatropine (HYCODAN) 5-1.5 MG/5ML syrup Take 5 mLs by mouth at bedtime as needed. 50 mL 0  . Multiple Vitamins-Minerals (MULTIVITAMIN PO) Take 1 capsule by mouth.     No current facility-administered medications on file prior to visit.     Allergies  Allergen Reactions  . Codeine Nausea And Vomiting and Other (See Comments)    Causes vomiting Hallucinations    Past Medical History:  Diagnosis Date  . Obesity     Past Surgical History:  Procedure Laterality Date  . ABDOMINAL HYSTERECTOMY  06/2008   vag hys secondary to Uterine leiomyomata  . MYOMECTOMY      Family History  Problem Relation Age of Onset  . Stroke Mother     Social History   Social History  . Marital status: Married    Spouse name: N/A  . Number of children: N/A  . Years of education: N/A   Occupational History  . Not on  file.   Social History Main Topics  . Smoking status: Never Smoker  . Smokeless tobacco: Never Used  . Alcohol use No  . Drug use: No  . Sexual activity: Not on file   Other Topics Concern  . 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   Married, 1 daughter- Tara Tanner age 5   The PMH, PSH, Social History, Family History, Medications, and allergies have been reviewed in [facility], and have been updated if relevant.   Review of Systems  Constitutional: Negative.   HENT: Negative.   Respiratory: Negative.   Cardiovascular: Negative.   Gastrointestinal: Negative.   Endocrine: Negative.   Genitourinary: Negative.   Musculoskeletal: Negative.   Allergic/Immunologic: Negative.   Neurological: Negative.   Hematological: Negative.   Psychiatric/Behavioral: Negative.   All other systems reviewed and are negative.      Objective:    BP 132/84 (BP Location: Left Arm, Patient Position: Sitting, Cuff Size: Large)   Pulse (!) 57   Temp 98 F (36.7 C) (Oral)   Ht 5\' 4"  (1.626 m)   Wt 233 lb (105.7 kg)   SpO2 98%   BMI 39.99 kg/m  Physical Exam   General:  Well-developed,well-nourished,in no acute distress; alert,appropriate and cooperative throughout examination Head:  normocephalic and atraumatic.   Eyes:  vision grossly intact, PERRL Ears:  R ear normal and L ear normal externally, TMs clear bilaterally Nose:  no external deformity.   Mouth:  good dentition.   Neck:  No deformities, masses, or tenderness noted. Breasts:  No mass, nodules, thickening, tenderness, bulging, retraction, inflamation, nipple discharge or skin changes noted.   Lungs:  Normal respiratory effort, chest expands symmetrically. Lungs are clear to auscultation, no crackles or wheezes. Heart:  Normal rate and regular rhythm. S1 and S2 normal without gallop, murmur, click, rub or other extra sounds. Abdomen:  Bowel sounds positive,abdomen soft and non-tender  without masses, organomegaly or hernias noted. Msk:  No deformity or scoliosis noted of thoracic or lumbar spine.   Extremities:  No clubbing, cyanosis, edema, or deformity noted with normal full range of motion of all joints.   Neurologic:  alert & oriented X3 and gait normal.   Skin:  Intact without suspicious lesions or rashes Cervical Nodes:  No lymphadenopathy noted Axillary Nodes:  No palpable lymphadenopathy Psych:  Cognition and judgment appear intact. Alert and cooperative with normal attention span and concentration. No apparent delusions, illusions, hallucinations       Assessment & Plan:   Well woman exam  Obesity due to excess calories without serious comorbidity, unspecified classification No Follow-up on file.

## 2016-12-20 NOTE — Progress Notes (Signed)
Pre visit review using our clinic review tool, if applicable. No additional management support is needed unless otherwise documented below in the visit note. 

## 2016-12-20 NOTE — Assessment & Plan Note (Signed)
Reviewed preventive care protocols, scheduled due services, and updated immunizations Discussed nutrition, exercise, diet, and healthy lifestyle.  Orders Placed This Encounter  Procedures  . Ambulatory referral to Gastroenterology

## 2016-12-20 NOTE — Patient Instructions (Signed)
Great to see you.   We are referring you to GI for a colonoscopy.

## 2017-01-26 ENCOUNTER — Encounter: Payer: Self-pay | Admitting: Family Medicine

## 2017-03-09 DIAGNOSIS — Z1231 Encounter for screening mammogram for malignant neoplasm of breast: Secondary | ICD-10-CM | POA: Diagnosis not present

## 2017-03-10 ENCOUNTER — Encounter: Payer: Self-pay | Admitting: Family Medicine

## 2017-04-18 ENCOUNTER — Telehealth: Payer: Self-pay | Admitting: Family Medicine

## 2017-04-18 NOTE — Telephone Encounter (Signed)
Pt had wellness screening form filled out 12/20/16. It was never received by insurance. She is requesting it to be refaxed to [phone]. Please call when completed or with questions.

## 2017-04-18 NOTE — Telephone Encounter (Signed)
Formed faxed to number provided [phone] and to not send to fax number on form as they have changed from what is listed.

## 2017-05-16 ENCOUNTER — Telehealth: Payer: Self-pay | Admitting: Family Medicine

## 2017-05-16 NOTE — Telephone Encounter (Signed)
Copy of form found under media. Copy printed off for the patient and confirmation showing that form was faxed as requested. Form given to [facility] for patient to pickup.

## 2017-05-16 NOTE — Telephone Encounter (Signed)
Caller Name:Tara Tanner  Relationship to Patient:self  Best number: Pharmacy:  Reason for call: pt called - states that form was never received by company. Is asking for [place] to fax the form again to the number below.  Pt would also like a copy of form for her records.    Fax number is [phone]

## 2017-05-16 NOTE — Telephone Encounter (Signed)
error 

## 2017-10-28 ENCOUNTER — Ambulatory Visit (INDEPENDENT_AMBULATORY_CARE_PROVIDER_SITE_OTHER): Payer: BLUE CROSS/BLUE SHIELD | Admitting: Podiatry

## 2017-10-28 ENCOUNTER — Ambulatory Visit (INDEPENDENT_AMBULATORY_CARE_PROVIDER_SITE_OTHER): Payer: BLUE CROSS/BLUE SHIELD

## 2017-10-28 ENCOUNTER — Encounter: Payer: Self-pay | Admitting: Podiatry

## 2017-10-28 DIAGNOSIS — M722 Plantar fascial fibromatosis: Secondary | ICD-10-CM

## 2017-10-28 MED ORDER — MELOXICAM 15 MG PO TABS: 15.0000 mg | ORAL_TABLET | Freq: Every day | ORAL | 1 refills | Status: AC

## 2017-10-28 NOTE — Progress Notes (Signed)
   Subjective:    Patient ID: Tara Tanner, female    DOB: 18-Dec-1965, 52 y.o.   MRN: [id number]  HPI    Review of Systems  All other systems reviewed and are negative.      Objective:   Physical Exam        Assessment & Plan:

## 2017-10-31 NOTE — Progress Notes (Signed)
   Subjective: Patient presents today for pain and tenderness in the plantar aspects of the bilateral heels that began about 6 months ago. Patient states that it hurts in the morning with the first steps out of bed. Walking and standing for long periods of time also increases the pain. She has not done anything for treatment. Patient presents today for further treatment and evaluation.  Past Medical History:  Diagnosis Date  . Obesity      Objective: Physical Exam General: The patient is alert and oriented x3 in no acute distress.  Dermatology: Skin is warm, dry and supple bilateral lower extremities. Negative for open lesions or macerations bilateral.   Vascular: Dorsalis Pedis and Posterior Tibial pulses palpable bilateral.  Capillary fill time is immediate to all digits.  Neurological: Epicritic and protective threshold intact bilateral.   Musculoskeletal: Tenderness to palpation to the plantar aspect of the bilateral heels along the plantar fascia. All other joints range of motion within normal limits bilateral. Strength 5/5 in all groups bilateral.   Radiographic exam: Normal osseous mineralization. Joint spaces preserved. No fracture/dislocation/boney destruction. No other soft tissue abnormalities or radiopaque foreign bodies.   Assessment: 1. plantar fasciitis bilateral feet  Plan of Care:  1. Patient evaluated. Xrays reviewed.   2. Injection of 0.5cc Celestone soluspan injected into the bilateral heels.  3. Rx for Mobic 15 mg provided to patient.  4. Appointment with Liliane Channel for custom molded orthotics.  5. Plantar fascial band(s) dispensed for bilateral plantar fasciitis. 6. Instructed patient regarding therapies and modalities at home to alleviate symptoms.  7. Return to clinic in 6 weeks.    Daughter graduates high school; going to [place] April 16 for 2 weeks.   Edrick Kins, DPM [facility]  Dr. Edrick Kins, DPM    [address], [place] [address]                Office [phone]  Fax [phone]

## 2017-11-09 ENCOUNTER — Other Ambulatory Visit: Payer: BLUE CROSS/BLUE SHIELD | Admitting: Orthotics

## 2017-11-25 ENCOUNTER — Ambulatory Visit (INDEPENDENT_AMBULATORY_CARE_PROVIDER_SITE_OTHER): Payer: BLUE CROSS/BLUE SHIELD | Admitting: Primary Care

## 2017-11-25 ENCOUNTER — Encounter: Payer: Self-pay | Admitting: Primary Care

## 2017-11-25 DIAGNOSIS — E6609 Other obesity due to excess calories: Secondary | ICD-10-CM | POA: Diagnosis not present

## 2017-11-25 DIAGNOSIS — M722 Plantar fascial fibromatosis: Secondary | ICD-10-CM

## 2017-11-25 NOTE — Progress Notes (Signed)
Subjective:    Patient ID: Tara Tanner, female    DOB: Dec 28, 1965, 52 y.o.   MRN: [id number]  HPI  Tara Tanner is a 52 year old female who presents today to transfer care from Dr. Deborra Medina.  1) Plantar Fasciitis: Located to bilateral heels that has been present for several months. She's currently managed on Meloxicam 15 mg once daily for which she just began. She's undergone bilateral heel injections recently and will undergo more injections in the future. She is following with podiatry.   2) Obesity: Long history of obesity. She was once managed on Phentermine years ago and didn't have success with weight loss. She endorses a poor diet and no exercise.   Diet currently consists of:  Breakfast: Skips, Yogurt/granola  Lunch: Peanut butter crackers, left overs, restaurant food Dinner: Restaurants (chicken, ribs, burgers, steak); sometimes prepares meals at home (roast, chicken, pasta, tacos).  Snacks: Occasional cheese, crackers, chips and dip, pickles  Desserts: None Beverages: Water, crystal light, occasional sweet tea  Exercise: Not currently exercise.    Review of Systems  Eyes: Negative for visual disturbance.  Respiratory: Negative for shortness of breath.   Cardiovascular: Negative for chest pain.  Musculoskeletal:       Bilateral heel pain  Neurological: Negative for dizziness and headaches.       Past Medical History:  Diagnosis Date  . Obesity      Social History   Socioeconomic History  . Marital status: Married    Spouse name: Not on file  . Number of children: Not on file  . Years of education: Not on file  . Highest education level: Not on file  Occupational History  . Not on file  Social Needs  . Financial resource strain: Not on file  . Food insecurity:    Worry: Not on file    Inability: Not on file  . Transportation needs:    Medical: Not on file    Non-medical: Not on file  Tobacco Use  . Smoking status: Never Smoker  . Smokeless tobacco: Never  Used  Substance and Sexual Activity  . Alcohol use: No  . Drug use: No  . Sexual activity: Not on file  Lifestyle  . Physical activity:    Days per week: Not on file    Minutes per session: Not on file  . Stress: Not on file  Relationships  . Social connections:    Talks on phone: Not on file    Gets together: Not on file    Attends religious service: Not on file    Active member of club or organization: Not on file    Attends meetings of clubs or organizations: Not on file    Relationship status: Not on file  . Intimate partner violence:    Fear of current or ex partner: Not on file    Emotionally abused: Not on file    Physically abused: Not on file    Forced sexual activity: Not on file  Other Topics Concern  . Not on file  Social History Narrative   Married.   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   1 daughter.   [place]jo[place] traveling, [place]    Past Surgical History:  Procedure Laterality Date  . ABDOMINAL HYSTERECTOMY  06/2008   vag hys secondary to Uterine leiomyomata  . MYOMECTOMY      Family History  Problem Relation Age of Onset  . Stroke Mother   .  Alzheimer's disease Mother   . Kidney cancer Father   . Rheum arthritis Father     Allergies  Allergen Reactions  . Codeine Nausea And Vomiting and Other (See Comments)    Causes vomiting Hallucinations    Current Outpatient Medications on File Prior to Visit  Medication Sig Dispense Refill  . meloxicam (MOBIC) 15 MG tablet Take 1 tablet (15 mg total) by mouth daily. 60 tablet 1   No current facility-administered medications on file prior to visit.     BP 126/80 (BP Location: Left Arm, Patient Position: Sitting, Cuff Size: Large)   Pulse 68   Temp 97.9 F (36.6 C) (Oral)   Ht 5\' 4"  (1.626 m)   Wt 243 lb (110.2 kg)   SpO2 95%   BMI 41.71 kg/m    Objective:   Physical Exam  Constitutional: She appears well-nourished.  Neck: Neck supple.  Cardiovascular: Normal rate  and regular rhythm.  Pulmonary/Chest: Effort normal and breath sounds normal.  Skin: Skin is warm and dry.          Assessment & Plan:

## 2017-11-25 NOTE — Patient Instructions (Signed)
Start exercising. You should be getting 150 minutes of moderate intensity exercise weekly.  Download the APP My Fitness Pal for calorie tracking.  Limit intake of sugary drinks such as sweet tea as this can add a lot of calories to your day.  Do some research regarding restaurant calorie content.  Increase fresh fruits, vegetables, whole grains, lean protein.  Ensure you are consuming 64 ounces of water daily.  Please schedule a physical with me anytime at your convenience.  We will discuss your lab results in detail during your physical.  It was a pleasure to see you today!   Calorie Counting for Weight Loss Calories are units of energy. Your body needs a certain amount of calories from food to keep you going throughout the day. When you eat more calories than your body needs, your body stores the extra calories as fat. When you eat fewer calories than your body needs, your body burns fat to get the energy it needs. Calorie counting means keeping track of how many calories you eat and drink each day. Calorie counting can be helpful if you need to lose weight. If you make sure to eat fewer calories than your body needs, you should lose weight. Ask your health care provider what a healthy weight is for you. For calorie counting to work, you will need to eat the right number of calories in a day in order to lose a healthy amount of weight per week. A dietitian can help you determine how many calories you need in a day and will give you suggestions on how to reach your calorie goal.  A healthy amount of weight to lose per week is usually 1-2 lb (0.5-0.9 kg). This usually means that your daily calorie intake should be reduced by 500-750 calories.  Eating 1,200 - 1,500 calories per day can help most women lose weight.  Eating 1,500 - 1,800 calories per day can help most men lose weight.  What is my plan? My goal is to have __________ calories per day. If I have this many calories per day, I  should lose around __________ pounds per week. What do I need to know about calorie counting? In order to meet your daily calorie goal, you will need to:  Find out how many calories are in each food you would like to eat. Try to do this before you eat.  Decide how much of the food you plan to eat.  Write down what you ate and how many calories it had. Doing this is called keeping a food log.  To successfully lose weight, it is important to balance calorie counting with a healthy lifestyle that includes regular activity. Aim for 150 minutes of moderate exercise (such as walking) or 75 minutes of vigorous exercise (such as running) each week. Where do I find calorie information?  The number of calories in a food can be found on a Nutrition Facts label. If a food does not have a Nutrition Facts label, try to look up the calories online or ask your dietitian for help. Remember that calories are listed per serving. If you choose to have more than one serving of a food, you will have to multiply the calories per serving by the amount of servings you plan to eat. For example, the label on a package of bread might say that a serving size is 1 slice and that there are 90 calories in a serving. If you eat 1 slice, you will have eaten 90  calories. If you eat 2 slices, you will have eaten 180 calories. How do I keep a food log? Immediately after each meal, record the following information in your food log:  What you ate. Don't forget to include toppings, sauces, and other extras on the food.  How much you ate. This can be measured in cups, ounces, or number of items.  How many calories each food and drink had.  The total number of calories in the meal.  Keep your food log near you, such as in a small notebook in your pocket, or use a mobile app or website. Some programs will calculate calories for you and show you how many calories you have left for the day to meet your goal. What are some calorie  counting tips?  Use your calories on foods and drinks that will fill you up and not leave you hungry: ? Some examples of foods that fill you up are nuts and nut butters, vegetables, lean proteins, and high-fiber foods like whole grains. High-fiber foods are foods with more than 5 g fiber per serving. ? Drinks such as sodas, specialty coffee drinks, alcohol, and juices have a lot of calories, yet do not fill you up.  Eat nutritious foods and avoid empty calories. Empty calories are calories you get from foods or beverages that do not have many vitamins or protein, such as candy, sweets, and soda. It is better to have a nutritious high-calorie food (such as an avocado) than a food with few nutrients (such as a bag of chips).  Know how many calories are in the foods you eat most often. This will help you calculate calorie counts faster.  Pay attention to calories in drinks. Low-calorie drinks include water and unsweetened drinks.  Pay attention to nutrition labels for "low fat" or "fat free" foods. These foods sometimes have the same amount of calories or more calories than the full fat versions. They also often have added sugar, starch, or salt, to make up for flavor that was removed with the fat.  Find a way of tracking calories that works for you. Get creative. Try different apps or programs if writing down calories does not work for you. What are some portion control tips?  Know how many calories are in a serving. This will help you know how many servings of a certain food you can have.  Use a measuring cup to measure serving sizes. You could also try weighing out portions on a kitchen scale. With time, you will be able to estimate serving sizes for some foods.  Take some time to put servings of different foods on your favorite plates, bowls, and cups so you know what a serving looks like.  Try not to eat straight from a bag or box. Doing this can lead to overeating. Put the amount you would  like to eat in a cup or on a plate to make sure you are eating the right portion.  Use smaller plates, glasses, and bowls to prevent overeating.  Try not to multitask (for example, watch TV or use your computer) while eating. If it is time to eat, sit down at a table and enjoy your food. This will help you to know when you are full. It will also help you to be aware of what you are eating and how much you are eating. What are tips for following this plan? Reading food labels  Check the calorie count compared to the serving size. The serving size  may be smaller than what you are used to eating.  Check the source of the calories. Make sure the food you are eating is high in vitamins and protein and low in saturated and trans fats. Shopping  Read nutrition labels while you shop. This will help you make healthy decisions before you decide to purchase your food.  Make a grocery list and stick to it. Cooking  Try to cook your favorite foods in a healthier way. For example, try baking instead of frying.  Use low-fat dairy products. Meal planning  Use more fruits and vegetables. Half of your plate should be fruits and vegetables.  Include lean proteins like poultry and fish. How do I count calories when eating out?  Ask for smaller portion sizes.  Consider sharing an entree and sides instead of getting your own entree.  If you get your own entree, eat only half. Ask for a box at the beginning of your meal and put the rest of your entree in it so you are not tempted to eat it.  If calories are listed on the menu, choose the lower calorie options.  Choose dishes that include vegetables, fruits, whole grains, low-fat dairy products, and lean protein.  Choose items that are boiled, broiled, grilled, or steamed. Stay away from items that are buttered, battered, fried, or served with cream sauce. Items labeled "crispy" are usually fried, unless stated otherwise.  Choose water, low-fat milk,  unsweetened iced tea, or other drinks without added sugar. If you want an alcoholic beverage, choose a lower calorie option such as a glass of wine or light beer.  Ask for dressings, sauces, and syrups on the side. These are usually high in calories, so you should limit the amount you eat.  If you want a salad, choose a garden salad and ask for grilled meats. Avoid extra toppings like bacon, cheese, or fried items. Ask for the dressing on the side, or ask for olive oil and vinegar or lemon to use as dressing.  Estimate how many servings of a food you are given. For example, a serving of cooked rice is  cup or about the size of half a baseball. Knowing serving sizes will help you be aware of how much food you are eating at restaurants. The list below tells you how big or small some common portion sizes are based on everyday objects: ? 1 oz-4 stacked dice. ? 3 oz-1 deck of cards. ? 1 tsp-1 die. ? 1 Tbsp- a ping-pong ball. ? 2 Tbsp-1 ping-pong ball. ?  cup- baseball. ? 1 cup-1 baseball. Summary  Calorie counting means keeping track of how many calories you eat and drink each day. If you eat fewer calories than your body needs, you should lose weight.  A healthy amount of weight to lose per week is usually 1-2 lb (0.5-0.9 kg). This usually means reducing your daily calorie intake by 500-750 calories.  The number of calories in a food can be found on a Nutrition Facts label. If a food does not have a Nutrition Facts label, try to look up the calories online or ask your dietitian for help.  Use your calories on foods and drinks that will fill you up, and not on foods and drinks that will leave you hungry.  Use smaller plates, glasses, and bowls to prevent overeating. This information is not intended to replace advice given to you by your health care provider. Make sure you discuss any questions you have with your health  care provider. Document Released: 08/16/2005 Document Revised: 07/16/2016  Document Reviewed: 07/16/2016 Elsevier Interactive Patient Education  [employer].

## 2017-11-25 NOTE — Assessment & Plan Note (Signed)
Located to bilateral heels, following with podiatry.

## 2017-11-25 NOTE — Assessment & Plan Note (Signed)
Discussed the importance of a healthy diet and regular exercise in order for weight loss, and to reduce the risk of any potential medical problems.  Information provided regarding calorie counting, necessary diet changes, and to start exercising.

## 2017-12-06 ENCOUNTER — Encounter: Payer: Self-pay | Admitting: Podiatry

## 2017-12-06 ENCOUNTER — Ambulatory Visit: Payer: BLUE CROSS/BLUE SHIELD | Admitting: Podiatry

## 2017-12-06 DIAGNOSIS — M722 Plantar fascial fibromatosis: Secondary | ICD-10-CM

## 2017-12-08 NOTE — Progress Notes (Signed)
   Subjective: 52 year old female presenting today for follow up evaluation of bilateral plantar fasciitis. She states the pain has improved but is still present intermittently. She reports relief after receiving the injection and taking Meloxicam. Patient presents today for further treatment and evaluation.  Past Medical History:  Diagnosis Date  . Obesity      Objective: Physical Exam General: The patient is alert and oriented x3 in no acute distress.  Dermatology: Skin is warm, dry and supple bilateral lower extremities. Negative for open lesions or macerations bilateral.   Vascular: Dorsalis Pedis and Posterior Tibial pulses palpable bilateral.  Capillary fill time is immediate to all digits.  Neurological: Epicritic and protective threshold intact bilateral.   Musculoskeletal: Tenderness to palpation to the plantar aspect of the bilateral heels along the plantar fascia. All other joints range of motion within normal limits bilateral. Strength 5/5 in all groups bilateral.    Assessment: 1. plantar fasciitis bilateral feet - resolved  Plan of Care:  1. Patient evaluated.  2. Continue taking Mobic and wearing plantar fascial braces as needed.  3. Recommended good shoe gear.  4. Patient will get custom molded orthotics once deductible is met.  5. Return to clinic in 3 weeks after trip to [place].   Daughter graduates high school; going to [place] April 16 for 2 weeks.   Edrick Kins, DPM [facility]  Dr. Edrick Kins, DPM    [address], [place] [address]                Office [phone]  Fax [phone]

## 2018-01-06 ENCOUNTER — Ambulatory Visit: Payer: BLUE CROSS/BLUE SHIELD | Admitting: Podiatry

## 2018-03-28 ENCOUNTER — Other Ambulatory Visit: Payer: Self-pay

## 2018-03-28 MED ORDER — MELOXICAM 15 MG PO TABS: 15.0000 mg | ORAL_TABLET | Freq: Every day | ORAL | 3 refills | Status: DC

## 2018-03-28 NOTE — Telephone Encounter (Signed)
Pharmacy refill request for Meloxicam.  Per Dr. Evans, ok to refill.   Script has been sent to pharmacy 

## 2018-06-07 ENCOUNTER — Other Ambulatory Visit: Payer: Self-pay | Admitting: Primary Care

## 2018-06-07 DIAGNOSIS — R7989 Other specified abnormal findings of blood chemistry: Secondary | ICD-10-CM

## 2018-06-07 DIAGNOSIS — Z0000 Encounter for general adult medical examination without abnormal findings: Secondary | ICD-10-CM

## 2018-06-13 DIAGNOSIS — M25511 Pain in right shoulder: Secondary | ICD-10-CM | POA: Diagnosis not present

## 2018-06-16 ENCOUNTER — Other Ambulatory Visit (INDEPENDENT_AMBULATORY_CARE_PROVIDER_SITE_OTHER): Payer: BLUE CROSS/BLUE SHIELD

## 2018-06-16 DIAGNOSIS — Z0000 Encounter for general adult medical examination without abnormal findings: Secondary | ICD-10-CM

## 2018-06-16 DIAGNOSIS — R7989 Other specified abnormal findings of blood chemistry: Secondary | ICD-10-CM

## 2018-06-16 LAB — COMPREHENSIVE METABOLIC PANEL
ALBUMIN: 4.2 g/dL (ref 3.5–5.2)
ALK PHOS: 90 U/L (ref 39–117)
ALT: 15 U/L (ref 0–35)
AST: 13 U/L (ref 0–37)
BUN: 24 mg/dL — ABNORMAL HIGH (ref 6–23)
CO2: 28 meq/L (ref 19–32)
CREATININE: 0.78 mg/dL (ref 0.40–1.20)
Calcium: 9.8 mg/dL (ref 8.4–10.5)
Chloride: 104 mEq/L (ref 96–112)
GFR: 81.93 mL/min (ref >60.00)
Glucose, Bld: 91 mg/dL (ref 70–99)
Potassium: 4 mEq/L (ref 3.5–5.1)
SODIUM: 139 meq/L (ref 135–145)
Total Bilirubin: 0.6 mg/dL (ref 0.2–1.2)
Total Protein: 7.5 g/dL (ref 6.0–8.3)

## 2018-06-16 LAB — LIPID PANEL
CHOL/HDL RATIO: 2
CHOLESTEROL: 135 mg/dL (ref 0–200)
HDL: 57.7 mg/dL (ref >39.00)
LDL CALC: 57 mg/dL (ref 0–99)
NonHDL: 77.21
TRIGLYCERIDES: 99 mg/dL (ref 0.0–149.0)
VLDL: 19.8 mg/dL (ref 0.0–40.0)

## 2018-06-16 LAB — TSH: TSH: 0.17 u[IU]/mL — ABNORMAL LOW (ref 0.35–4.50)

## 2018-06-27 ENCOUNTER — Encounter: Payer: Self-pay | Admitting: Primary Care

## 2018-06-27 ENCOUNTER — Ambulatory Visit (INDEPENDENT_AMBULATORY_CARE_PROVIDER_SITE_OTHER): Payer: BLUE CROSS/BLUE SHIELD | Admitting: Primary Care

## 2018-06-27 ENCOUNTER — Encounter: Payer: Self-pay | Admitting: Gastroenterology

## 2018-06-27 VITALS — BP 122/70 | HR 64 | Temp 98.2°F | Ht 63.75 in | Wt 241.0 lb

## 2018-06-27 DIAGNOSIS — R7989 Other specified abnormal findings of blood chemistry: Secondary | ICD-10-CM | POA: Diagnosis not present

## 2018-06-27 DIAGNOSIS — Z1211 Encounter for screening for malignant neoplasm of colon: Secondary | ICD-10-CM

## 2018-06-27 DIAGNOSIS — M722 Plantar fascial fibromatosis: Secondary | ICD-10-CM | POA: Diagnosis not present

## 2018-06-27 DIAGNOSIS — Z0000 Encounter for general adult medical examination without abnormal findings: Secondary | ICD-10-CM

## 2018-06-27 NOTE — Patient Instructions (Addendum)
Stop by the lab prior to leaving today. I will notify you of your results once received.   You will be contacted regarding your referral to GI for the colonoscopy, and also for the ultrasound of the thyroid gland.  Please let us know if you have not been contacted within one week.   Start exercising. You should be getting 150 minutes of moderate intensity exercise weekly.  It's important to improve your diet by reducing consumption of fast food, fried food, processed snack foods, sugary drinks. Increase consumption of fresh vegetables and fruits, whole grains, water.  Ensure you are drinking 64 ounces of water daily.  Complete your mammogram as scheduled, have them send me a report.  It was a pleasure to see you today!   Preventive Care 40-64 Years, Female Preventive care refers to lifestyle choices and visits with your health care provider that can promote health and wellness. What does preventive care include?  A yearly physical exam. This is also called an annual well check.  Dental exams once or twice a year.  Routine eye exams. Ask your health care provider how often you should have your eyes checked.  Personal lifestyle choices, including: ? Daily care of your teeth and gums. ? Regular physical activity. ? Eating a healthy diet. ? Avoiding tobacco and drug use. ? Limiting alcohol use. ? Practicing safe sex. ? Taking low-dose aspirin daily starting at age 27. ? Taking vitamin and mineral supplements as recommended by your health care provider. What happens during an annual well check? The services and screenings done by your health care provider during your annual well check will depend on your age, overall health, lifestyle risk factors, and family history of disease. Counseling Your health care provider may ask you questions about your:  Alcohol use.  Tobacco use.  Drug use.  Emotional well-being.  Home and relationship well-being.  Sexual activity.  Eating  habits.  Work and work Statistician.  Method of birth control.  Menstrual cycle.  Pregnancy history.  Screening You may have the following tests or measurements:  Height, weight, and BMI.  Blood pressure.  Lipid and cholesterol levels. These may be checked every 5 years, or more frequently if you are over 107 years old.  Skin check.  Lung cancer screening. You may have this screening every year starting at age 13 if you have a 30-pack-year history of smoking and currently smoke or have quit within the past 15 years.  Fecal occult blood test (FOBT) of the stool. You may have this test every year starting at age 58.  Flexible sigmoidoscopy or colonoscopy. You may have a sigmoidoscopy every 5 years or a colonoscopy every 10 years starting at age 36.  Hepatitis C blood test.  Hepatitis B blood test.  Sexually transmitted disease (STD) testing.  Diabetes screening. This is done by checking your blood sugar (glucose) after you have not eaten for a while (fasting). You may have this done every 1-3 years.  Mammogram. This may be done every 1-2 years. Talk to your health care provider about when you should start having regular mammograms. This may depend on whether you have a family history of breast cancer.  BRCA-related cancer screening. This may be done if you have a family history of breast, ovarian, tubal, or peritoneal cancers.  Pelvic exam and Pap test. This may be done every 3 years starting at age 46. Starting at age 70, this may be done every 5 years if you have a Pap  test in combination with an HPV test.  Bone density scan. This is done to screen for osteoporosis. You may have this scan if you are at high risk for osteoporosis.  Discuss your test results, treatment options, and if necessary, the need for more tests with your health care provider. Vaccines Your health care provider may recommend certain vaccines, such as:  Influenza vaccine. This is recommended every  year.  Tetanus, diphtheria, and acellular pertussis (Tdap, Td) vaccine. You may need a Td booster every 10 years.  Varicella vaccine. You may need this if you have not been vaccinated.  Zoster vaccine. You may need this after age 10.  Measles, mumps, and rubella (MMR) vaccine. You may need at least one dose of MMR if you were born in 1957 or later. You may also need a second dose.  Pneumococcal 13-valent conjugate (PCV13) vaccine. You may need this if you have certain conditions and were not previously vaccinated.  Pneumococcal polysaccharide (PPSV23) vaccine. You may need one or two doses if you smoke cigarettes or if you have certain conditions.  Meningococcal vaccine. You may need this if you have certain conditions.  Hepatitis A vaccine. You may need this if you have certain conditions or if you travel or work in places where you may be exposed to hepatitis A.  Hepatitis B vaccine. You may need this if you have certain conditions or if you travel or work in places where you may be exposed to hepatitis B.  Haemophilus influenzae type b (Hib) vaccine. You may need this if you have certain conditions.  Talk to your health care provider about which screenings and vaccines you need and how often you need them. This information is not intended to replace advice given to you by your health care provider. Make sure you discuss any questions you have with your health care provider. Document Released: 09/12/2015 Document Revised: 05/05/2016 Document Reviewed: 06/17/2015 Elsevier Interactive Patient Education  [employer].

## 2018-06-27 NOTE — Assessment & Plan Note (Signed)
Intermittent symptoms, moreso with increased with prolonged periods of walking. Using Meloxicam PRN. Continue same.

## 2018-06-27 NOTE — Assessment & Plan Note (Signed)
Tetanus UTD per patient, declines influenza vaccination. Mammogram due, scheduled for this week.  Colonoscopy due, referral placed to GI. Exam unremarkable. Labs reviewed. Discussed the importance of a healthy diet and regular exercise in order for weight loss, and to reduce the risk of any potential medical problems. Follow up in 1 year for CPE.

## 2018-06-27 NOTE — Progress Notes (Signed)
Subjective:    Patient ID: Tara Tanner, female    DOB: Jun 11, 1966, 52 y.o.   MRN: [id number]  HPI  [name] is a 52 year old female who presents today for complete physical.  Immunizations: -Tetanus: Unsure, believes it's been within 10 years.  -Influenza: Declines    Diet: She endorses a fair diet Breakfast: Granola bar, skips Lunch: Left overs, crackers, soup Dinner: Meat, vegetables, starch, restaurant food often Snacks: Crackers, chips and dips Desserts: Once weekly  Beverages: Water, occasional sweet tea, occasional coffee  Exercise: She is not exercising  Eye exam: Completed in 2019 Dental exam: Completes semi-annually  Colonoscopy: Due, never completed  Pap Smear: Hysterectomy  Mammogram: Completed in 2018, due this week at [facility]. She will send the report.    Review of Systems  Constitutional: Negative for unexpected weight change.  HENT: Negative for rhinorrhea.   Respiratory: Negative for cough and shortness of breath.   Cardiovascular: Negative for chest pain.  Gastrointestinal: Negative for constipation and diarrhea.  Genitourinary: Negative for difficulty urinating.       Hot flashes  Musculoskeletal: Negative for arthralgias and myalgias.  Skin: Negative for rash.  Allergic/Immunologic: Negative for environmental allergies.  Neurological: Negative for dizziness, numbness and headaches.  Psychiatric/Behavioral: The patient is not nervous/anxious.        Past Medical History:  Diagnosis Date  . Obesity      Social History   Socioeconomic History  . Marital status: Married    Spouse name: Not on file  . Number of children: Not on file  . Years of education: Not on file  . Highest education level: Not on file  Occupational History  . Not on file  Social Needs  . Financial resource strain: Not on file  . Food insecurity:    Worry: Not on file    Inability: Not on file  . Transportation needs:    Medical: Not on file    Non-medical: Not on  file  Tobacco Use  . Smoking status: Never Smoker  . Smokeless tobacco: Never Used  Substance and Sexual Activity  . Alcohol use: No  . Drug use: No  . Sexual activity: Not on file  Lifestyle  . Physical activity:    Days per week: Not on file    Minutes per session: Not on file  . Stress: Not on file  Relationships  . Social connections:    Talks on phone: Not on file    Gets together: Not on file    Attends religious service: Not on file    Active member of club or organization: Not on file    Attends meetings of clubs or organizations: Not on file    Relationship status: Not on file  . Intimate partner violence:    Fear of current or ex partner: Not on file    Emotionally abused: Not on file    Physically abused: Not on file    Forced sexual activity: Not on file  Other Topics Concern  . Not on file  Social History Narrative   Married.   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   1 daughter.   Enjo[place] traveling, [place]    Past Surgical History:  Procedure Laterality Date  . ABDOMINAL HYSTERECTOMY  06/2008   vag hys secondary to Uterine leiomyomata  . MYOMECTOMY      Family History  Problem Relation Age of Onset  . Stroke Mother   .  Alzheimer's disease Mother   . Kidney cancer Father   . Rheum arthritis Father     Allergies  Allergen Reactions  . Codeine Nausea And Vomiting and Other (See Comments)    Causes vomiting Hallucinations    Current Outpatient Medications on File Prior to Visit  Medication Sig Dispense Refill  . meloxicam (MOBIC) 15 MG tablet Take 1 tablet (15 mg total) by mouth daily. 30 tablet 3   No current facility-administered medications on file prior to visit.     BP 128/78 (BP Location: Left Arm, Patient Position: Sitting, Cuff Size: Large)   Pulse 64   Temp 98.2 F (36.8 C) (Oral)   Ht 5' 3.75" (1.619 m)   Wt 241 lb (109.3 kg)   SpO2 99%   BMI 41.69 kg/m    Objective:   Physical Exam    Constitutional: She is oriented to person, place, and time. She appears well-nourished.  HENT:  Mouth/Throat: No oropharyngeal exudate.  Eyes: Pupils are equal, round, and reactive to light. EOM are normal.  Neck: Neck supple. No thyromegaly present.  Cardiovascular: Normal rate and regular rhythm.  Respiratory: Effort normal and breath sounds normal.  GI: Soft. Bowel sounds are normal. There is no tenderness.  Musculoskeletal: Normal range of motion.  Neurological: She is alert and oriented to person, place, and time.  Skin: Skin is warm and dry.  Psychiatric: She has a normal mood and affect.           Assessment & Plan:

## 2018-06-27 NOTE — Assessment & Plan Note (Signed)
TSH decreased again. Check ultrasound of the thyroid given history of altering levels. Repeat TSH and add Free T4 in 6 weeks. No thyroidmegaly or obvious nodules on exam.

## 2018-06-29 DIAGNOSIS — Z1231 Encounter for screening mammogram for malignant neoplasm of breast: Secondary | ICD-10-CM | POA: Diagnosis not present

## 2018-06-29 LAB — HM MAMMOGRAPHY

## 2018-06-30 ENCOUNTER — Other Ambulatory Visit: Payer: Self-pay | Admitting: Primary Care

## 2018-06-30 ENCOUNTER — Ambulatory Visit
Admission: RE | Admit: 2018-06-30 | Discharge: 2018-06-30 | Disposition: A | Discharge status: Home / Self Care | Payer: BLUE CROSS/BLUE SHIELD | Source: Ambulatory Visit | Attending: Primary Care | Admitting: Primary Care

## 2018-06-30 ENCOUNTER — Encounter: Payer: Self-pay | Admitting: Primary Care

## 2018-06-30 DIAGNOSIS — R7989 Other specified abnormal findings of blood chemistry: Secondary | ICD-10-CM

## 2018-06-30 DIAGNOSIS — E042 Nontoxic multinodular goiter: Secondary | ICD-10-CM | POA: Diagnosis not present

## 2018-07-03 ENCOUNTER — Telehealth: Payer: Self-pay

## 2018-07-03 NOTE — Telephone Encounter (Signed)
Pt left v/m requesting cb 07/03/18 about US Thyroid results done on 06/30/18.

## 2018-07-04 NOTE — Telephone Encounter (Signed)
Message left for patient to return my call.  Notes recorded by Pleas Koch, NP on 06/30/2018 at 4:26 PM EDT Please notify patient that her ultrasound shows benign appearing nodules on her thyroid gland.  We will repeat labs as discussed and scheduled.

## 2018-07-04 NOTE — Telephone Encounter (Signed)
Spoken and notified patient of [name]'s comments. Patient verbalized understanding.  

## 2018-07-04 NOTE — Telephone Encounter (Signed)
Pt left v/m requesting cb about Thyroid US report.

## 2018-07-04 NOTE — Telephone Encounter (Signed)
Pt cb to get results

## 2018-07-06 ENCOUNTER — Other Ambulatory Visit: Payer: Self-pay | Admitting: Internal Medicine

## 2018-07-06 MED ORDER — OSELTAMIVIR PHOSPHATE 75 MG PO CAPS: 75.0000 mg | ORAL_CAPSULE | Freq: Every day | ORAL | 0 refills | Status: DC

## 2018-07-21 ENCOUNTER — Ambulatory Visit (AMBULATORY_SURGERY_CENTER): Payer: Self-pay

## 2018-07-21 VITALS — Ht 64.0 in | Wt 245.2 lb

## 2018-07-21 DIAGNOSIS — Z1211 Encounter for screening for malignant neoplasm of colon: Secondary | ICD-10-CM

## 2018-07-21 MED ORDER — PEG 3350-KCL-NA BICARB-NACL 420 G PO SOLR: 4000.0000 mL | Freq: Once | ORAL | 0 refills | Status: AC

## 2018-07-21 NOTE — Progress Notes (Signed)
Denies allergies to eggs or soy products. Denies complication of anesthesia or sedation. Denies use of weight loss medication. Denies use of O2.   Emmi instructions declined.  

## 2018-07-26 ENCOUNTER — Encounter: Payer: Self-pay | Admitting: Gastroenterology

## 2018-08-08 ENCOUNTER — Other Ambulatory Visit (INDEPENDENT_AMBULATORY_CARE_PROVIDER_SITE_OTHER): Payer: BLUE CROSS/BLUE SHIELD

## 2018-08-08 DIAGNOSIS — R7989 Other specified abnormal findings of blood chemistry: Secondary | ICD-10-CM

## 2018-08-08 LAB — T4, FREE: FREE T4: 0.92 ng/dL (ref 0.60–1.60)

## 2018-08-08 LAB — TSH: TSH: 0.33 u[IU]/mL — AB (ref 0.35–4.50)

## 2018-08-09 LAB — THYROID PEROXIDASE ANTIBODY

## 2018-08-10 ENCOUNTER — Telehealth: Payer: Self-pay | Admitting: *Deleted

## 2018-08-10 ENCOUNTER — Other Ambulatory Visit: Payer: Self-pay | Admitting: Primary Care

## 2018-08-10 DIAGNOSIS — R7989 Other specified abnormal findings of blood chemistry: Secondary | ICD-10-CM

## 2018-08-10 NOTE — Telephone Encounter (Signed)
addressed in result note

## 2018-08-10 NOTE — Telephone Encounter (Signed)
-----   Message from Pleas Koch, NP sent at 08/09/2018  4:53 PM EST ----- Please notify patient:  Thyroid level is still abnormal.  Given these continued low readings I recommend she see an endocrinologist for further evaluation of the thyroid gland function. Is she agreeable?

## 2018-08-10 NOTE — Telephone Encounter (Signed)
Message left for patient to return my call.  

## 2018-08-10 NOTE — Telephone Encounter (Signed)
Pt returned Chan's call.

## 2018-08-11 ENCOUNTER — Ambulatory Visit (AMBULATORY_SURGERY_CENTER): Payer: BLUE CROSS/BLUE SHIELD | Admitting: Gastroenterology

## 2018-08-11 ENCOUNTER — Encounter: Payer: Self-pay | Admitting: Gastroenterology

## 2018-08-11 ENCOUNTER — Other Ambulatory Visit: Payer: BLUE CROSS/BLUE SHIELD

## 2018-08-11 VITALS — BP 123/57 | HR 52 | Temp 97.8°F | Resp 16 | Ht 64.0 in | Wt 245.0 lb

## 2018-08-11 DIAGNOSIS — D127 Benign neoplasm of rectosigmoid junction: Secondary | ICD-10-CM

## 2018-08-11 DIAGNOSIS — K635 Polyp of colon: Secondary | ICD-10-CM | POA: Diagnosis not present

## 2018-08-11 DIAGNOSIS — D122 Benign neoplasm of ascending colon: Secondary | ICD-10-CM

## 2018-08-11 DIAGNOSIS — D123 Benign neoplasm of transverse colon: Secondary | ICD-10-CM

## 2018-08-11 DIAGNOSIS — Z1211 Encounter for screening for malignant neoplasm of colon: Secondary | ICD-10-CM

## 2018-08-11 MED ORDER — SODIUM CHLORIDE 0.9 % IV SOLN: 500.0000 mL | Freq: Once | INTRAVENOUS | Status: DC

## 2018-08-11 NOTE — Progress Notes (Signed)
PT taken to PACU. Monitors in place. VSS. Report given to RN. 

## 2018-08-11 NOTE — Progress Notes (Signed)
Called to room to assist during endoscopic procedure.  Patient ID and intended procedure confirmed with present staff. Received instructions for my participation in the procedure from the performing physician.  

## 2018-08-11 NOTE — Progress Notes (Signed)
Pt's states no medical or surgical changes since previsit or office visit. 

## 2018-08-11 NOTE — Patient Instructions (Signed)
YOU HAD AN ENDOSCOPIC PROCEDURE TODAY AT [facility]:   Refer to the procedure report that was given to you for any specific questions about what was found during the examination.  If the procedure report does not answer your questions, please call your gastroenterologist to clarify.  If you requested that your care partner not be given the details of your procedure findings, then the procedure report has been included in a sealed envelope for you to review at your convenience later.  YOU SHOULD EXPECT: Some feelings of bloating in the abdomen. Passage of more gas than usual.  Walking can help get rid of the air that was put into your GI tract during the procedure and reduce the bloating. If you had a lower endoscopy (such as a colonoscopy or flexible sigmoidoscopy) you may notice spotting of blood in your stool or on the toilet paper. If you underwent a bowel prep for your procedure, you may not have a normal bowel movement for a few days.  Please Note:  You might notice some irritation and congestion in your nose or some drainage.  This is from the oxygen used during your procedure.  There is no need for concern and it should clear up in a day or so.  SYMPTOMS TO REPORT IMMEDIATELY:   Following lower endoscopy (colonoscopy or flexible sigmoidoscopy):  Excessive amounts of blood in the stool  Significant tenderness or worsening of abdominal pains  Swelling of the abdomen that is new, acute  Fever of 100F or higher   For urgent or emergent issues, a gastroenterologist can be reached at any hour by calling [phone].   DIET:  We do recommend a small meal at first, but then you may proceed to your regular diet.  Drink plenty of fluids but you should avoid alcoholic beverages for 24 hours.  ACTIVITY:  You should plan to take it easy for the rest of today and you should NOT DRIVE or use heavy machinery until tomorrow (because of the sedation medicines used during the test).     FOLLOW UP: Our staff will call the number listed on your records the next business day following your procedure to check on you and address any questions or concerns that you may have regarding the information given to you following your procedure. If we do not reach you, we will leave a message.  However, if you are feeling well and you are not experiencing any problems, there is no need to return our call.  We will assume that you have returned to your regular daily activities without incident.  If any biopsies were taken you will be contacted by phone or by letter within the next 1-3 weeks.  Please call us at [phone] if you have not heard about the biopsies in 3 weeks.    SIGNATURES/CONFIDENTIALITY: You and/or your care partner have signed paperwork which will be entered into your electronic medical record.  These signatures attest to the fact that that the information above on your After Visit Summary has been reviewed and is understood.  Full responsibility of the confidentiality of this discharge information lies with you and/or your care-partner.  Polyps, diverticulosis, high fiber diet, and hemorrhoid information given.  Try citrucel, metamucil, konsyl, and fibercon

## 2018-08-11 NOTE — Op Note (Signed)
[facility] Patient Name: Tara Tanner Procedure Date: 08/11/2018 9:06 AM MRN: [id number] Endoscopist: Justice Britain , MD Age: 52 Referring MD:  Date of Birth: 22-Aug-1966 Gender: Female Account #: [id number] Procedure:                Colonoscopy Indications:              Screening for colorectal malignant neoplasm Medicines:                Monitored Anesthesia Care Procedure:                Pre-Anesthesia Assessment:                           - Prior to the procedure, a History and Physical                            was performed, and patient medications and                            allergies were reviewed. The patient's tolerance of                            previous anesthesia was also reviewed. The risks                            and benefits of the procedure and the sedation                            options and risks were discussed with the patient.                            All questions were answered, and informed consent                            was obtained. Prior Anticoagulants: The patient has                            taken previous NSAID medication. ASA Grade                            Assessment: II - A patient with mild systemic                            disease. After reviewing the risks and benefits,                            the patient was deemed in satisfactory condition to                            undergo the procedure.                           After obtaining informed consent, the colonoscope  was passed under direct vision. Throughout the                            procedure, the patient's blood pressure, pulse, and                            oxygen saturations were monitored continuously. The                            Colonoscope was introduced through the anus and                            advanced to the 5 cm into the ileum. The                            colonoscopy was performed without difficulty.  The                            patient tolerated the procedure. The quality of the                            bowel preparation was evaluated using the BBPS                            [facility] Bowel Preparation Scale) with scores of:                            Right Colon = 3, Transverse Colon = 3 and Left                            Colon = 3 (entire mucosa seen well with no residual                            staining, small fragments of stool or opaque                            liquid). The total BBPS score equals 9. Scope In: 9:22:18 AM Scope Out: 9:35:06 AM Scope Withdrawal Time: 0 hours 10 minutes 6 seconds  Total Procedure Duration: 0 hours 12 minutes 48 seconds  Findings:                 The digital rectal exam was normal. Pertinent                            negatives include no palpable rectal lesions.                           The terminal ileum and ileocecal valve appeared                            normal.                           Three sessile polyps were found  in the descending                            colon, transverse colon and ascending colon. The                            polyps were 1 to 2 mm in size. These polyps were                            removed with a cold biopsy forceps. Resection and                            retrieval were complete.                           Many small-mouthed diverticula were found in the                            recto-sigmoid colon, sigmoid colon, descending                            colon, transverse colon and ascending colon                            (predominance on the left colon.                           Normal mucosa was found in the entire colon                            otherwise.                           Non-bleeding non-thrombosed internal hemorrhoids                            were found during retroflexion. The hemorrhoids                            were Grade I (internal hemorrhoids that do not                             prolapse). Complications:            No immediate complications. Estimated Blood Loss:     Estimated blood loss was minimal. Impression:               - The examined portion of the ileum was normal.                           - Three 1 to 2 mm polyps in the descending colon,                            in the transverse colon and in the ascending colon,  removed with a cold biopsy forceps. Resected and                            retrieved.                           - Diverticulosis in the recto-sigmoid colon, in the                            sigmoid colon, in the descending colon, in the                            transverse colon and in the ascending colon                            (predominance in the left colon).                           - Normal mucosa in the entire examined colon                            otherwise.                           - Non-bleeding non-thrombosed internal hemorrhoids. Recommendation:           - The patient will be observed post-procedure,                            until all discharge criteria are met.                           - Discharge patient to home.                           - Patient has a contact number available for                            emergencies. The signs and symptoms of potential                            delayed complications were discussed with the                            patient. Return to normal activities tomorrow.                            Written discharge instructions were provided to the                            patient.                           - High fiber diet.                           - Use  fiber, for example Citrucel, Fibercon, Konsyl                            or Metamucil.                           - Continue present medications.                           - Await pathology results.                           - Repeat colonoscopy in 11/01/08 years for                             surveillance based on pathology results and                            findings of adenomatous tissue.                           - The findings and recommendations were discussed                            with the patient.                           - The findings and recommendations were discussed                            with the patient's family. Justice Britain, MD 08/11/2018 9:43:25 AM

## 2018-08-14 ENCOUNTER — Telehealth: Payer: Self-pay

## 2018-08-14 NOTE — Telephone Encounter (Signed)
  Follow up Call-  Call back number 08/11/2018  Post procedure Call Back phone  # [phone]  Permission to leave phone message Yes  Some recent data might be hidden     Patient questions:  Do you have a fever, pain , or abdominal swelling? No. Pain Score  0 *  Have you tolerated food without any problems? Yes.    Have you been able to return to your normal activities? Yes.    Do you have any questions about your discharge instructions: Diet   No. Medications  No. Follow up visit  No.  Do you have questions or concerns about your Care? No.  Actions: * If pain score is 4 or above: No action needed, pain <4.

## 2018-08-17 ENCOUNTER — Encounter: Payer: Self-pay | Admitting: Gastroenterology

## 2018-08-25 ENCOUNTER — Encounter: Payer: Self-pay | Admitting: Internal Medicine

## 2018-08-25 ENCOUNTER — Ambulatory Visit (INDEPENDENT_AMBULATORY_CARE_PROVIDER_SITE_OTHER): Payer: BLUE CROSS/BLUE SHIELD | Admitting: Internal Medicine

## 2018-08-25 VITALS — BP 110/62 | HR 63 | Ht 64.2 in | Wt 240.6 lb

## 2018-08-25 DIAGNOSIS — R7989 Other specified abnormal findings of blood chemistry: Secondary | ICD-10-CM

## 2018-08-25 DIAGNOSIS — E042 Nontoxic multinodular goiter: Secondary | ICD-10-CM

## 2018-08-25 NOTE — Patient Instructions (Signed)
We will set you up for "Thyroid Uptake and Scan" which  works like this: We would first check a thyroid "scan" (a special, but easy and painless type of thyroid x ray).  you go to the x-ray department of the hospital to swallow a pill, which contains a miniscule amount of radiation.  You will not notice any symptoms from this.  You will go back to the x-ray department the next day, to lie down in front of a camera.  The results of this will be sent to me.

## 2018-08-25 NOTE — Progress Notes (Signed)
Name: Tara Tanner  MRN/ DOB: [id number], 1965-11-02    Age/ Sex: 52 y.o., female    PCP: Pleas Koch, NP   Reason for Endocrinology Evaluation: LOW TSH     Date of Initial Endocrinology Evaluation: 08/28/2018     HPI: Ms. Tanner Tara is a 52 y.o. female with unremarkable past medical history. The patient presented for initial endocrinology clinic visit on 08/28/2018 for consultative assistance with her low TSH.   Pt presented to her PCP in October, 2019 with hot flashes. Her lab work revealed low TSH at 0.17 uIU/mL , repeat TSH on December, 10th, 2019 was 0.33 uIU/M with normal FT4.    In review of her records, she did have another low TSH in 2017, at 0.24 uIU/mL .    Today she is c/o weight loss, has heat intolerance and fatigue, denies diarrhea or anxiety or tremors.     She had hysterectomy at age 42 but ovaries intact. Hot flashes for the past 1-2 yrs.    Denies local neck symptoms. No FH of thyroid disease.    HISTORY:  Past Medical History:  Past Medical History:  Diagnosis Date  . Allergy   . Obesity    Past Surgical History:  Past Surgical History:  Procedure Laterality Date  . ABDOMINAL HYSTERECTOMY  06/2008   vag hys secondary to Uterine leiomyomata  . CESAREAN SECTION  2001  . DILATION AND CURETTAGE, DIAGNOSTIC / THERAPEUTIC    . MYOMECTOMY    . TONSILLECTOMY        Social History:  reports that she has never smoked. She has never used smokeless tobacco. She reports that she does not drink alcohol or use drugs.  Family History: family history includes Alzheimer's disease in her mother; Kidney cancer in her father; Rheum arthritis in her father; Stroke in her mother.   HOME MEDICATIONS: Current Outpatient Medications on File Prior to Visit  Medication Sig Dispense Refill  . meloxicam (MOBIC) 15 MG tablet Take 1 tablet (15 mg total) by mouth daily. (Patient taking differently: Take 15 mg by mouth as needed. ) 30 tablet 3   No current  facility-administered medications on file prior to visit.       REVIEW OF SYSTEMS: A comprehensive ROS was conducted with the patient and is negative except as per HPI and below:  Review of Systems  Constitutional: Positive for malaise/fatigue. Negative for weight loss.  HENT: Negative for congestion and sore throat.   Eyes: Negative for blurred vision and pain.  Respiratory: Negative for cough and shortness of breath.   Cardiovascular: Negative for chest pain and palpitations.  Gastrointestinal: Negative for diarrhea and nausea.  Genitourinary: Positive for frequency.  Musculoskeletal: Negative for back pain and joint pain.  Skin: Negative.   Neurological: Negative for tingling and tremors.  Endo/Heme/Allergies: Positive for polydipsia.  Psychiatric/Behavioral: Negative for depression. The patient is not nervous/anxious.        OBJECTIVE:  VS: BP 110/62 (BP Location: Left Arm, Patient Position: Sitting, Cuff Size: Large)   Pulse 63   Ht 5' 4.2" (1.631 m)   Wt 240 lb 9.6 oz (109.1 kg)   SpO2 97%   BMI 41.04 kg/m    Wt Readings from Last 3 Encounters:  08/25/18 240 lb 9.6 oz (109.1 kg)  08/11/18 245 lb (111.1 kg)  07/21/18 245 lb 3.2 oz (111.2 kg)     EXAM: General: Pt appears well and is in NAD  Hydration: Well-hydrated with moist  mucous membranes and good skin turgor  Eyes: External eye exam normal without stare, lid lag or exophthalmos.  EOM intact.  PERRL.  Ears, Nose, Throat: Hearing: Grossly intact bilaterally Dental: Good dentition  Throat: Clear without mass, erythema or exudate  Neck: General: Supple without adenopathy. Thyroid: Thyroid size normal.  No goiter or nodules appreciated. No thyroid bruit.  Lungs: Clear with good BS bilat with no rales, rhonchi, or wheezes  Heart: Auscultation: RRR.  Abdomen: Normoactive bowel sounds, soft, nontender, without masses or organomegaly palpable  Extremities: Gait and station: Normal gait  Digits and nails: No  clubbing, cyanosis, petechiae, or nodes Head and neck: Normal alignment and mobility BL UE: Normal ROM and strength. BL LE: No pretibial edema normal ROM and strength.  Skin: Hair: Texture and amount normal with gender appropriate distribution Skin Inspection: No rashes, acanthosis nigricans/skin tags. No lipohypertrophy Skin Palpation: Skin temperature, texture, and thickness normal to palpation  Neuro: Cranial nerves: II - XII grossly intact  Cerebellar: Normal coordination and movement; no tremor Motor: Normal strength throughout DTRs: 2+ and symmetric in UE without delay in relaxation phase  Mental Status: Judgment, insight: Intact Orientation: Oriented to time, place, and person Memory: Intact for recent and remote events Mood and affect: No depression, anxiety, or agitation     DATA REVIEWED: Results for ELYSABETH, AUST (MRN [id number]) as of 08/25/2018 13:51  Ref. Range 12/10/2016 12:58 06/16/2018 12:36 08/08/2018 14:29  TSH Latest Ref Range: 0.35 - 4.50 uIU/mL 0.38 0.17 (L) 0.33 (L)  T4,Free(Direct) Latest Ref Range: 0.60 - 1.60 ng/dL   0.92  Thyroperoxidase Ab SerPl-aCnc Latest Ref Range: <9 IU/mL   <1  Thyroid ultrasound ( 06/30/2018)  Enlarged and markedly heterogeneous appearing thyroid gland without discrete worrisome thyroid nodule or mass. Findings are nonspecific though could be seen in the setting of a thyroiditis. Clinical correlation is advised.  ASSESSMENT/PLAN/RECOMMENDATIONS:   1. Subclinical hyperthyroidism :   -The causes of subclinical hyperthyroidism are the same as the causes of overt hyperthyroidism, and like overt hyperthyroidism, subclinical hyperthyroidism can be persistent or transient. Common causes of subclinical hyperthyroidism include excessive thyroid hormone therapy (exogenous subclinical hyperthyroidism), autonomously functioning thyroid adenomas and multinodular goiters (endogenous subclinical hyperthyroidism), or Graves' disease (endogenous  subclinical hyperthyroidism).   Most patients with subclinical hyperthyroidism have no clinical manifestations of hyperthyroidism, and those symptoms that are present (eg, tachycardia, tremor, dyspnea on exertion, weight loss) are mild and nonspecific." However, subclinical hyperthyroidism is associated with an increased risk of atrial fibrillation and, primarily in postmenopausal women, a decrease in bone mineral density.  - Will proceed with thyroid uptake and Scan    F/u in 6 weeks    Signed electronically by: Mack Guise, MD  [facility]  [address] [place], [place] [address] Phone: [phone] FAX: [phone]   CC: Ple[place], [place] [address] Phone: [phone] Fax: [phone]   Return to Endocrinology clinic as below: Future Appointments  Date Time Provider [place]  10/06/2018  1:00 PM Shamleffer, Melanie Crazier, MD [facility] None

## 2018-08-28 NOTE — Progress Notes (Signed)
Please advise 

## 2018-09-14 ENCOUNTER — Encounter (HOSPITAL_COMMUNITY): Payer: BLUE CROSS/BLUE SHIELD

## 2018-09-15 ENCOUNTER — Encounter (HOSPITAL_COMMUNITY): Payer: BLUE CROSS/BLUE SHIELD

## 2018-10-02 ENCOUNTER — Ambulatory Visit: Payer: BLUE CROSS/BLUE SHIELD | Admitting: Internal Medicine

## 2018-10-06 ENCOUNTER — Ambulatory Visit: Payer: BLUE CROSS/BLUE SHIELD | Admitting: Internal Medicine

## 2018-11-08 DIAGNOSIS — T1512XA Foreign body in conjunctival sac, left eye, initial encounter: Secondary | ICD-10-CM | POA: Diagnosis not present

## 2018-12-02 ENCOUNTER — Other Ambulatory Visit: Payer: Self-pay | Admitting: Podiatry

## 2019-03-22 ENCOUNTER — Other Ambulatory Visit: Payer: Self-pay | Admitting: Primary Care

## 2019-03-22 DIAGNOSIS — Z0000 Encounter for general adult medical examination without abnormal findings: Secondary | ICD-10-CM

## 2019-03-22 DIAGNOSIS — R7989 Other specified abnormal findings of blood chemistry: Secondary | ICD-10-CM

## 2019-03-26 ENCOUNTER — Other Ambulatory Visit: Payer: BLUE CROSS/BLUE SHIELD

## 2019-03-28 ENCOUNTER — Ambulatory Visit: Payer: BLUE CROSS/BLUE SHIELD | Admitting: Primary Care

## 2019-06-29 ENCOUNTER — Other Ambulatory Visit (INDEPENDENT_AMBULATORY_CARE_PROVIDER_SITE_OTHER): Payer: BC Managed Care – PPO

## 2019-06-29 ENCOUNTER — Other Ambulatory Visit: Payer: Self-pay

## 2019-06-29 DIAGNOSIS — Z0000 Encounter for general adult medical examination without abnormal findings: Secondary | ICD-10-CM

## 2019-06-29 DIAGNOSIS — R7989 Other specified abnormal findings of blood chemistry: Secondary | ICD-10-CM | POA: Diagnosis not present

## 2019-06-29 LAB — COMPREHENSIVE METABOLIC PANEL
ALT: 20 U/L (ref 0–35)
AST: 20 U/L (ref 0–37)
Albumin: 4.3 g/dL (ref 3.5–5.2)
Alkaline Phosphatase: 100 U/L (ref 39–117)
BUN: 15 mg/dL (ref 6–23)
CO2: 29 mEq/L (ref 19–32)
Calcium: 9.8 mg/dL (ref 8.4–10.5)
Chloride: 104 mEq/L (ref 96–112)
Creatinine, Ser: 0.75 mg/dL (ref 0.40–1.20)
GFR: 80.34 mL/min (ref >60.00)
Glucose, Bld: 81 mg/dL (ref 70–99)
Potassium: 4.3 mEq/L (ref 3.5–5.1)
Sodium: 140 mEq/L (ref 135–145)
Total Bilirubin: 0.7 mg/dL (ref 0.2–1.2)
Total Protein: 6.9 g/dL (ref 6.0–8.3)

## 2019-06-29 LAB — TSH: TSH: 0.32 u[IU]/mL — ABNORMAL LOW (ref 0.35–4.50)

## 2019-06-29 LAB — HEMOGLOBIN A1C: Hgb A1c MFr Bld: 5 % (ref 4.6–6.5)

## 2019-06-29 LAB — LIPID PANEL
Cholesterol: 139 mg/dL (ref 0–200)
HDL: 59.4 mg/dL (ref >39.00)
LDL Cholesterol: 66 mg/dL (ref 0–99)
NonHDL: 79.4
Total CHOL/HDL Ratio: 2
Triglycerides: 65 mg/dL (ref 0.0–149.0)
VLDL: 13 mg/dL (ref 0.0–40.0)

## 2019-07-02 ENCOUNTER — Encounter: Payer: Self-pay | Admitting: Primary Care

## 2019-07-02 ENCOUNTER — Ambulatory Visit (INDEPENDENT_AMBULATORY_CARE_PROVIDER_SITE_OTHER): Payer: BC Managed Care – PPO | Admitting: Primary Care

## 2019-07-02 ENCOUNTER — Other Ambulatory Visit: Payer: Self-pay

## 2019-07-02 VITALS — BP 124/84 | HR 68 | Temp 97.0°F | Ht 64.5 in | Wt 231.5 lb

## 2019-07-02 DIAGNOSIS — Z0000 Encounter for general adult medical examination without abnormal findings: Secondary | ICD-10-CM

## 2019-07-02 DIAGNOSIS — R7989 Other specified abnormal findings of blood chemistry: Secondary | ICD-10-CM | POA: Diagnosis not present

## 2019-07-02 DIAGNOSIS — M722 Plantar fascial fibromatosis: Secondary | ICD-10-CM

## 2019-07-02 DIAGNOSIS — Z23 Encounter for immunization: Secondary | ICD-10-CM

## 2019-07-02 NOTE — Assessment & Plan Note (Signed)
Resolved since undergoing injections per podiatry, using Meloxicam PRN.

## 2019-07-02 NOTE — Patient Instructions (Addendum)
Start exercising. You should be getting 150 minutes of moderate intensity exercise weekly.  It's important to improve your diet by reducing consumption of fast food, fried food, processed snack foods, sugary drinks. Increase consumption of fresh vegetables and fruits, whole grains, water.  Ensure you are drinking 64 ounces of water daily.  Schedule a nurse visit for the shingles vaccinations, wait at least one month.  Schedule a lab appointment in three months for thyroid tests.   It was a pleasure to see you today!   Preventive Care 82-51 Years Old, Female Preventive care refers to visits with your health care provider and lifestyle choices that can promote health and wellness. This includes:  A yearly physical exam. This may also be called an annual well check.  Regular dental visits and eye exams.  Immunizations.  Screening for certain conditions.  Healthy lifestyle choices, such as eating a healthy diet, getting regular exercise, not using drugs or products that contain nicotine and tobacco, and limiting alcohol use. What can I expect for my preventive care visit? Physical exam Your health care provider will check your:  Height and weight. This may be used to calculate body mass index (BMI), which tells if you are at a healthy weight.  Heart rate and blood pressure.  Skin for abnormal spots. Counseling Your health care provider may ask you questions about your:  Alcohol, tobacco, and drug use.  Emotional well-being.  Home and relationship well-being.  Sexual activity.  Eating habits.  Work and work Statistician.  Method of birth control.  Menstrual cycle.  Pregnancy history. What immunizations do I need?  Influenza (flu) vaccine  This is recommended every year. Tetanus, diphtheria, and pertussis (Tdap) vaccine  You may need a Td booster every 10 years. Varicella (chickenpox) vaccine  You may need this if you have not been vaccinated. Zoster  (shingles) vaccine  You may need this after age 66. Measles, mumps, and rubella (MMR) vaccine  You may need at least one dose of MMR if you were born in 1957 or later. You may also need a second dose. Pneumococcal conjugate (PCV13) vaccine  You may need this if you have certain conditions and were not previously vaccinated. Pneumococcal polysaccharide (PPSV23) vaccine  You may need one or two doses if you smoke cigarettes or if you have certain conditions. Meningococcal conjugate (MenACWY) vaccine  You may need this if you have certain conditions. Hepatitis A vaccine  You may need this if you have certain conditions or if you travel or work in places where you may be exposed to hepatitis A. Hepatitis B vaccine  You may need this if you have certain conditions or if you travel or work in places where you may be exposed to hepatitis B. Haemophilus influenzae type b (Hib) vaccine  You may need this if you have certain conditions. Human papillomavirus (HPV) vaccine  If recommended by your health care provider, you may need three doses over 6 months. You may receive vaccines as individual doses or as more than one vaccine together in one shot (combination vaccines). Talk with your health care provider about the risks and benefits of combination vaccines. What tests do I need? Blood tests  Lipid and cholesterol levels. These may be checked every 5 years, or more frequently if you are over 81 years old.  Hepatitis C test.  Hepatitis B test. Screening  Lung cancer screening. You may have this screening every year starting at age 49 if you have a 30-pack-year history  of smoking and currently smoke or have quit within the past 15 years.  Colorectal cancer screening. All adults should have this screening starting at age 68 and continuing until age 25. Your health care provider may recommend screening at age 97 if you are at increased risk. You will have tests every 1-10 years, depending  on your results and the type of screening test.  Diabetes screening. This is done by checking your blood sugar (glucose) after you have not eaten for a while (fasting). You may have this done every 1-3 years.  Mammogram. This may be done every 1-2 years. Talk with your health care provider about when you should start having regular mammograms. This may depend on whether you have a family history of breast cancer.  BRCA-related cancer screening. This may be done if you have a family history of breast, ovarian, tubal, or peritoneal cancers.  Pelvic exam and Pap test. This may be done every 3 years starting at age 65. Starting at age 66, this may be done every 5 years if you have a Pap test in combination with an HPV test. Other tests  Sexually transmitted disease (STD) testing.  Bone density scan. This is done to screen for osteoporosis. You may have this scan if you are at high risk for osteoporosis. Follow these instructions at home: Eating and drinking  Eat a diet that includes fresh fruits and vegetables, whole grains, lean protein, and low-fat dairy.  Take vitamin and mineral supplements as recommended by your health care provider.  Do not drink alcohol if: ? Your health care provider tells you not to drink. ? You are pregnant, may be pregnant, or are planning to become pregnant.  If you drink alcohol: ? Limit how much you have to 0-1 drink a day. ? Be aware of how much alcohol is in your drink. In the [place] one drink equals one 12 oz bottle of beer (355 mL), one 5 oz glass of wine (148 mL), or one 1 oz glass of hard liquor (44 mL). Lifestyle  Take daily care of your teeth and gums.  Stay active. Exercise for at least 30 minutes on 5 or more days each week.  Do not use any products that contain nicotine or tobacco, such as cigarettes, e-cigarettes, and chewing tobacco. If you need help quitting, ask your health care provider.  If you are sexually active, practice safe sex. Use  a condom or other form of birth control (contraception) in order to prevent pregnancy and STIs (sexually transmitted infections).  If told by your health care provider, take low-dose aspirin daily starting at age 23. What's next?  Visit your health care provider once a year for a well check visit.  Ask your health care provider how often you should have your eyes and teeth checked.  Stay up to date on all vaccines. This information is not intended to replace advice given to you by your health care provider. Make sure you discuss any questions you have with your health care provider. Document Released: 09/12/2015 Document Revised: 04/27/2018 Document Reviewed: 04/27/2018 Elsevier Patient Education  2020 [employer].

## 2019-07-02 NOTE — Progress Notes (Signed)
Subjective:    Patient ID: Tara Tanner, female    DOB: 1965-12-23, 53 y.o.   MRN: [id number]  HPI  Tara Tanner is a 53 year old female who presents today for complete physical.  Immunizations: -Tetanus: Unsure. -Influenza: Will later this week -Shingles: Never completed  Diet: She endorses a poor diet. She eats too large of portions. Exercise: She is not exercising.  Eye exam: Completed in 2019 Dental exam: Completes semi-annually   Pap Smear: Hysterectomy, ovaries remain Mammogram: Completed in 2019 Colonoscopy: Completed in 2019, due again in 2029  BP Readings from Last 3 Encounters:  07/02/19 124/84  08/25/18 110/62  08/11/18 (!) 123/57      Review of Systems  Constitutional: Negative for unexpected weight change.  HENT: Negative for rhinorrhea.   Respiratory: Negative for cough and shortness of breath.   Cardiovascular: Negative for chest pain.  Gastrointestinal: Negative for constipation and diarrhea.  Genitourinary: Negative for difficulty urinating.  Musculoskeletal: Negative for arthralgias and myalgias.  Skin: Negative for rash.  Allergic/Immunologic: Negative for environmental allergies.  Neurological: Negative for dizziness, numbness and headaches.  Psychiatric/Behavioral: The patient is not nervous/anxious.        Past Medical History:  Diagnosis Date  . Allergy   . Obesity      Social History   Socioeconomic History  . Marital status: Married    Spouse name: Not on file  . Number of children: Not on file  . Years of education: Not on file  . Highest education level: Not on file  Occupational History  . Not on file  Social Needs  . Financial resource strain: Not on file  . Food insecurity    Worry: Not on file    Inability: Not on file  . Transportation needs    Medical: Not on file    Non-medical: Not on file  Tobacco Use  . Smoking status: Never Smoker  . Smokeless tobacco: Never Used  Substance and Sexual Activity  . Alcohol  use: No  . Drug use: No  . Sexual activity: Not on file  Lifestyle  . Physical activity    Days per week: Not on file    Minutes per session: Not on file  . Stress: Not on file  Relationships  . Social Herbalist on phone: Not on file    Gets together: Not on file    Attends religious service: Not on file    Active member of club or organization: Not on file    Attends meetings of clubs or organizations: Not on file    Relationship status: Not on file  . Intimate partner violence    Fear of current or ex partner: Not on file    Emotionally abused: Not on file    Physically abused: Not on file    Forced sexual activity: Not on file  Other Topics Concern  . Not on file  Social History Narrative   Married.   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   1 daughter.   [place], [place]    Past Surgical History:  Procedure Laterality Date  . ABDOMINAL HYSTERECTOMY  06/2008   vag hys secondary to Uterine leiomyomata  . CESAREAN SECTION  2001  . DILATION AND CURETTAGE, DIAGNOSTIC / THERAPEUTIC    . MYOMECTOMY    . TONSILLECTOMY      Family History  Problem Relation Age of Onset  . Stroke Mother   .  Alzheimer's disease Mother   . Kidney cancer Father   . Rheum arthritis Father   . Colon cancer Neg Hx   . Esophageal cancer Neg Hx   . Stomach cancer Neg Hx   . Rectal cancer Neg Hx     Allergies  Allergen Reactions  . Codeine Nausea And Vomiting and Other (See Comments)    Causes vomiting Hallucinations    No current outpatient medications on file prior to visit.   No current facility-administered medications on file prior to visit.     BP 124/84   Pulse 68   Temp (!) 97 F (36.1 C) (Temporal)   Ht 5' 4.5" (1.638 m)   Wt 231 lb 8 oz (105 kg)   SpO2 98%   BMI 39.12 kg/m    Objective:   Physical Exam  Constitutional: She is oriented to person, place, and time. She appears well-nourished.  HENT:  Right Ear:  Tympanic membrane and ear canal normal.  Left Ear: Tympanic membrane and ear canal normal.  Mouth/Throat: Oropharynx is clear and moist.  Eyes: Pupils are equal, round, and reactive to light. EOM are normal.  Neck: Neck supple.  Cardiovascular: Normal rate and regular rhythm.  Respiratory: Effort normal and breath sounds normal.  GI: Soft. Bowel sounds are normal. There is no abdominal tenderness.  Musculoskeletal: Normal range of motion.  Neurological: She is alert and oriented to person, place, and time.  Skin: Skin is warm and dry.  Psychiatric: She has a normal mood and affect.           Assessment & Plan:

## 2019-07-02 NOTE — Assessment & Plan Note (Addendum)
TSH today of 0.32 which is an increase from initial check in 2019, stable from most recent check. Other labs negative.  She did see endocrinology last year who recommended thyroid uptake scan, she never completed.   She kindly declines further follow up with endocrinology, will repeat labs in 3 months, add other thyroid studies.  Reviewed thyroid ultrasound which was unremarkable.

## 2019-07-02 NOTE — Addendum Note (Signed)
Addended by: Jacqualin Combes on: 07/02/2019 04:36 PM   Modules accepted: Orders

## 2019-07-02 NOTE — Assessment & Plan Note (Signed)
Tetanus due, provided today. Shingrix due, she will schedule. She will get influenza vaccination later this week. Mammogram due, she will schedule. Colonoscopy UTD, due in 2029. Discussed the importance of a healthy diet and regular exercise in order for weight loss, and to reduce the risk of any potential medical problems. Exam today unremarkable. Labs reviewed.

## 2019-07-12 ENCOUNTER — Other Ambulatory Visit: Payer: Self-pay

## 2019-07-12 ENCOUNTER — Ambulatory Visit (INDEPENDENT_AMBULATORY_CARE_PROVIDER_SITE_OTHER): Payer: BC Managed Care – PPO

## 2019-07-12 DIAGNOSIS — Z23 Encounter for immunization: Secondary | ICD-10-CM | POA: Diagnosis not present

## 2019-08-15 ENCOUNTER — Ambulatory Visit: Payer: BC Managed Care – PPO

## 2019-08-27 DIAGNOSIS — Z1231 Encounter for screening mammogram for malignant neoplasm of breast: Secondary | ICD-10-CM | POA: Diagnosis not present

## 2019-08-27 LAB — HM MAMMOGRAPHY

## 2019-08-29 ENCOUNTER — Ambulatory Visit: Payer: BC Managed Care – PPO

## 2019-08-29 ENCOUNTER — Other Ambulatory Visit: Payer: Self-pay

## 2019-08-30 ENCOUNTER — Encounter: Payer: Self-pay | Admitting: Primary Care

## 2019-09-18 ENCOUNTER — Emergency Department (HOSPITAL_COMMUNITY): Payer: BC Managed Care – PPO

## 2019-09-18 ENCOUNTER — Inpatient Hospital Stay (HOSPITAL_COMMUNITY)
Admission: EM | Admit: 2019-09-18 | Discharge: 2019-09-19 | DRG: 445 | Disposition: A | Discharge status: Home / Self Care | Payer: BC Managed Care – PPO | Attending: Student | Admitting: Student

## 2019-09-18 ENCOUNTER — Encounter (HOSPITAL_COMMUNITY): Payer: Self-pay | Admitting: Emergency Medicine

## 2019-09-18 DIAGNOSIS — K8020 Calculus of gallbladder without cholecystitis without obstruction: Secondary | ICD-10-CM | POA: Diagnosis not present

## 2019-09-18 DIAGNOSIS — R030 Elevated blood-pressure reading, without diagnosis of hypertension: Secondary | ICD-10-CM | POA: Diagnosis not present

## 2019-09-18 DIAGNOSIS — R52 Pain, unspecified: Secondary | ICD-10-CM | POA: Diagnosis not present

## 2019-09-18 DIAGNOSIS — K8060 Calculus of gallbladder and bile duct with cholecystitis, unspecified, without obstruction: Secondary | ICD-10-CM | POA: Diagnosis not present

## 2019-09-18 DIAGNOSIS — Z90710 Acquired absence of both cervix and uterus: Secondary | ICD-10-CM

## 2019-09-18 DIAGNOSIS — E869 Volume depletion, unspecified: Secondary | ICD-10-CM | POA: Diagnosis present

## 2019-09-18 DIAGNOSIS — R739 Hyperglycemia, unspecified: Secondary | ICD-10-CM | POA: Diagnosis present

## 2019-09-18 DIAGNOSIS — R109 Unspecified abdominal pain: Secondary | ICD-10-CM

## 2019-09-18 DIAGNOSIS — J302 Other seasonal allergic rhinitis: Secondary | ICD-10-CM | POA: Diagnosis not present

## 2019-09-18 DIAGNOSIS — R1030 Lower abdominal pain, unspecified: Secondary | ICD-10-CM | POA: Diagnosis not present

## 2019-09-18 DIAGNOSIS — I289 Disease of pulmonary vessels, unspecified: Secondary | ICD-10-CM | POA: Diagnosis not present

## 2019-09-18 DIAGNOSIS — R112 Nausea with vomiting, unspecified: Secondary | ICD-10-CM | POA: Diagnosis not present

## 2019-09-18 DIAGNOSIS — Z20822 Contact with and (suspected) exposure to covid-19: Secondary | ICD-10-CM | POA: Diagnosis present

## 2019-09-18 DIAGNOSIS — R079 Chest pain, unspecified: Secondary | ICD-10-CM | POA: Diagnosis not present

## 2019-09-18 DIAGNOSIS — N179 Acute kidney failure, unspecified: Secondary | ICD-10-CM | POA: Diagnosis present

## 2019-09-18 DIAGNOSIS — K760 Fatty (change of) liver, not elsewhere classified: Secondary | ICD-10-CM | POA: Diagnosis present

## 2019-09-18 DIAGNOSIS — K838 Other specified diseases of biliary tract: Secondary | ICD-10-CM | POA: Diagnosis not present

## 2019-09-18 HISTORY — DX: Calculus of gallbladder without cholecystitis without obstruction: K80.20

## 2019-09-18 LAB — CBC
HCT: 43.6 % (ref 36.0–46.0)
Hemoglobin: 14.6 g/dL (ref 12.0–15.0)
MCH: 29.1 pg (ref 26.0–34.0)
MCHC: 33.5 g/dL (ref 30.0–36.0)
MCV: 87 fL (ref 80.0–100.0)
Platelets: 388 10*3/uL (ref 150–400)
RBC: 5.01 MIL/uL (ref 3.87–5.11)
RDW: 12.6 % (ref 11.5–15.5)
WBC: 11.8 10*3/uL — ABNORMAL HIGH (ref 4.0–10.5)
nRBC: 0 % (ref 0.0–0.2)

## 2019-09-18 LAB — COMPREHENSIVE METABOLIC PANEL
ALT: 27 U/L (ref 0–44)
AST: 31 U/L (ref 15–41)
Albumin: 4.4 g/dL (ref 3.5–5.0)
Alkaline Phosphatase: 94 U/L (ref 38–126)
Anion gap: 11 (ref 5–15)
BUN: 19 mg/dL (ref 6–20)
CO2: 22 mmol/L (ref 22–32)
Calcium: 10.2 mg/dL (ref 8.9–10.3)
Chloride: 108 mmol/L (ref 98–111)
Creatinine, Ser: 1.06 mg/dL — ABNORMAL HIGH (ref 0.44–1.00)
GFR calc Af Amer: >60 mL/min (ref >60)
GFR calc non Af Amer: 59 mL/min — ABNORMAL LOW (ref >60)
Glucose, Bld: 151 mg/dL — ABNORMAL HIGH (ref 70–99)
Potassium: 4.5 mmol/L (ref 3.5–5.1)
Sodium: 141 mmol/L (ref 135–145)
Total Bilirubin: 1 mg/dL (ref 0.3–1.2)
Total Protein: 7.6 g/dL (ref 6.5–8.1)

## 2019-09-18 LAB — MAGNESIUM: Magnesium: 2.2 mg/dL (ref 1.7–2.4)

## 2019-09-18 LAB — TROPONIN I (HIGH SENSITIVITY)
Troponin I (High Sensitivity): 2 ng/L (ref <18)
Troponin I (High Sensitivity): 3 ng/L (ref <18)

## 2019-09-18 LAB — TSH: TSH: 0.425 u[IU]/mL (ref 0.350–4.500)

## 2019-09-18 LAB — LACTIC ACID, PLASMA: Lactic Acid, Venous: 2.2 mmol/L (ref 0.5–1.9)

## 2019-09-18 LAB — HEMOGLOBIN A1C
Hgb A1c MFr Bld: 5 % (ref 4.8–5.6)
Mean Plasma Glucose: 96.8 mg/dL

## 2019-09-18 LAB — LIPASE, BLOOD: Lipase: 26 U/L (ref 11–51)

## 2019-09-18 LAB — PHOSPHORUS: Phosphorus: 2.3 mg/dL — ABNORMAL LOW (ref 2.5–4.6)

## 2019-09-18 LAB — HIV ANTIBODY (ROUTINE TESTING W REFLEX): HIV Screen 4th Generation wRfx: NONREACTIVE

## 2019-09-18 MED ORDER — HALOPERIDOL LACTATE 5 MG/ML IJ SOLN
2.0000 mg | Freq: Once | INTRAMUSCULAR | Status: AC
  Administered 2019-09-18: 20:00:00 2 mg via INTRAVENOUS
  Filled 2019-09-18: qty 1

## 2019-09-18 MED ORDER — LORAZEPAM 2 MG/ML IJ SOLN
1.0000 mg | Freq: Once | INTRAMUSCULAR | Status: DC
  Filled 2019-09-18: qty 1

## 2019-09-18 MED ORDER — IOHEXOL 350 MG/ML SOLN
100.0000 mL | Freq: Once | INTRAVENOUS | Status: AC | PRN
  Administered 2019-09-18: 19:00:00 100 mL via INTRAVENOUS

## 2019-09-18 MED ORDER — HYDROMORPHONE HCL 1 MG/ML IJ SOLN
1.0000 mg | INTRAMUSCULAR | Status: DC | PRN
  Administered 2019-09-18 – 2019-09-19 (×2): 1 mg via INTRAVENOUS
  Filled 2019-09-18 (×2): qty 1

## 2019-09-18 MED ORDER — ONDANSETRON HCL 4 MG/2ML IJ SOLN
4.0000 mg | Freq: Once | INTRAMUSCULAR | Status: AC
  Administered 2019-09-18: 18:00:00 4 mg via INTRAVENOUS
  Filled 2019-09-18: qty 2

## 2019-09-18 MED ORDER — FENTANYL CITRATE (PF) 100 MCG/2ML IJ SOLN
100.0000 ug | Freq: Once | INTRAMUSCULAR | Status: AC
  Administered 2019-09-18: 18:00:00 100 ug via INTRAVENOUS
  Filled 2019-09-18: qty 2

## 2019-09-18 MED ORDER — PIPERACILLIN-TAZOBACTAM 3.375 G IVPB
3.3750 g | Freq: Three times a day (TID) | INTRAVENOUS | Status: DC
  Administered 2019-09-18 – 2019-09-19 (×2): 3.375 g via INTRAVENOUS
  Filled 2019-09-18 (×4): qty 50

## 2019-09-18 MED ORDER — PANTOPRAZOLE SODIUM 40 MG IV SOLR
40.0000 mg | Freq: Two times a day (BID) | INTRAVENOUS | Status: DC
  Administered 2019-09-18 – 2019-09-19 (×2): 40 mg via INTRAVENOUS
  Filled 2019-09-18 (×2): qty 40

## 2019-09-18 MED ORDER — SODIUM CHLORIDE 0.9% FLUSH: 3.0000 mL | Freq: Once | INTRAVENOUS | Status: DC

## 2019-09-18 MED ORDER — METOCLOPRAMIDE HCL 5 MG/ML IJ SOLN
5.0000 mg | Freq: Four times a day (QID) | INTRAMUSCULAR | Status: DC
  Administered 2019-09-19 (×2): 5 mg via INTRAVENOUS
  Filled 2019-09-18 (×2): qty 2

## 2019-09-18 MED ORDER — FENTANYL CITRATE (PF) 100 MCG/2ML IJ SOLN
50.0000 ug | Freq: Once | INTRAMUSCULAR | Status: AC
  Administered 2019-09-18: 19:00:00 50 ug via INTRAVENOUS
  Filled 2019-09-18: qty 2

## 2019-09-18 MED ORDER — SODIUM CHLORIDE 0.9 % IV SOLN: INTRAVENOUS | Status: DC

## 2019-09-18 NOTE — ED Notes (Signed)
Attempted to call nursing report.  

## 2019-09-18 NOTE — ED Provider Notes (Signed)
[facility] Emergency Department Provider Note MRN:  [id number]  Arrival date & time: 09/18/19     Chief Complaint   Abdominal Pain and Diarrhea   History of Present Illness   Tara Tanner is a 54 y.o. year-old female with a history of hysterectomy presenting to the ED with chief complaint of abdominal pain.  Patient was in her normal state of health until 4:30 PM, when she experienced acute severe abdominal pain radiating from her periumbilical region downward and upward into her chest and lower abdomen.  Pain is 10 out of 10, constant, severe, difficult to describe.  Radiates to back.  Associated with dry heaving.  Denies recent fever or cough.  Review of Systems  A complete 10 system review of systems was obtained and all systems are negative except as noted in the HPI and PMH.   Patient's Health History    Past Medical History:  Diagnosis Date  . Allergy   . Obesity     Past Surgical History:  Procedure Laterality Date  . ABDOMINAL HYSTERECTOMY  06/2008   vag hys secondary to Uterine leiomyomata  . CESAREAN SECTION  2001  . DILATION AND CURETTAGE, DIAGNOSTIC / THERAPEUTIC    . MYOMECTOMY    . TONSILLECTOMY      Family History  Problem Relation Age of Onset  . Stroke Mother   . Alzheimer's disease Mother   . Kidney cancer Father   . Rheum arthritis Father   . Colon cancer Neg Hx   . Esophageal cancer Neg Hx   . Stomach cancer Neg Hx   . Rectal cancer Neg Hx     Social History   Socioeconomic History  . Marital status: Married    Spouse name: Not on file  . Number of children: Not on file  . Years of education: Not on file  . Highest education level: Not on file  Occupational History  . Not on file  Tobacco Use  . Smoking status: Never Smoker  . Smokeless tobacco: Never Used  Substance and Sexual Activity  . Alcohol use: No  . Drug use: No  . Sexual activity: Not on file  Other Topics Concern  . Not on file  Social History  Narrative   Married.   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   1 daughter.   Enjoys traveling, spending time at the lake.   Social Determinants of Health   Financial Resource Strain:   . Difficulty of Paying Living Expenses: Not on file  Food Insecurity:   . Worried About Charity fundraiser in the Last Year: Not on file  . Ran Out of Food in the Last Year: Not on file  Transportation Needs:   . Lack of Transportation (Medical): Not on file  . Lack of Transportation (Non-Medical): Not on file  Physical Activity:   . Days of Exercise per Week: Not on file  . Minutes of Exercise per Session: Not on file  Stress:   . Feeling of Stress : Not on file  Social Connections:   . Frequency of Communication with Friends and Family: Not on file  . Frequency of Social Gatherings with Friends and Family: Not on file  . Attends Religious Services: Not on file  . Active Member of Clubs or Organizations: Not on file  . Attends Archivist Meetings: Not on file  . Marital Status: Not on file  Intimate Partner Violence:   . Fear of Current or  Ex-Partner: Not on file  . Emotionally Abused: Not on file  . Physically Abused: Not on file  . Sexually Abused: Not on file     Physical Exam   Vitals:   09/18/19 1703 09/18/19 1815  BP: (!) 155/99 (!) 113/57  Pulse: (!) 108 (!) 42  Resp: 17   Temp: 97.9 F (36.6 C)   SpO2: 100% 99%    CONSTITUTIONAL: Well-appearing, diaphoretic, dry heaving, in moderate distress due to pain NEURO:  Alert and oriented x 3, no focal deficits EYES:  eyes equal and reactive ENT/NECK:  no LAD, no JVD CARDIO: Tachycardic rate, well-perfused, normal S1 and S2 PULM:  CTAB no wheezing or rhonchi GI/GU:  normal bowel sounds, non-distended, non-tender MSK/SPINE:  No gross deformities, no edema SKIN:  no rash, atraumatic PSYCH:  Appropriate speech and behavior  *Additional and/or pertinent findings included in MDM below  Diagnostic and  Interventional Summary    EKG Interpretation  Date/Time:  Tuesday September 18 2019 17:38:56 EST Ventricular Rate:  54 PR Interval:    QRS Duration: 98 QT Interval:  462 QTC Calculation: 438 R Axis:   44 Text Interpretation: Sinus rhythm Baseline wander in lead(s) I III aVL V4 V5 V6 No significant change was found Confirmed by Gerlene Fee [phone]) on 09/18/2019 5:41:17 PM      Labs Reviewed  COMPREHENSIVE METABOLIC PANEL - Abnormal; Notable for the following components:      Result Value   Glucose, Bld 151 (*)    Creatinine, Ser 1.06 (*)    GFR calc non Af Amer 59 (*)    All other components within normal limits  LACTIC ACID, PLASMA - Abnormal; Notable for the following components:   Lactic Acid, Venous 2.2 (*)    All other components within normal limits  CBC - Abnormal; Notable for the following components:   WBC 11.8 (*)    All other components within normal limits  PHOSPHORUS - Abnormal; Notable for the following components:   Phosphorus 2.3 (*)    All other components within normal limits  RESPIRATORY PANEL BY RT PCR (FLU A&B, COVID)  LIPASE, BLOOD  HIV ANTIBODY (ROUTINE TESTING W REFLEX)  MAGNESIUM  URINALYSIS, ROUTINE W REFLEX MICROSCOPIC  TSH  BASIC METABOLIC PANEL  CBC  RAPID URINE DRUG SCREEN, HOSP PERFORMED  HEMOGLOBIN A1C  TROPONIN I (HIGH SENSITIVITY)  TROPONIN I (HIGH SENSITIVITY)    US Abdomen Limited RUQ  Final Result    CT Angio Chest/Abd/Pel for Dissection W and/or Wo Contrast  Final Result    DG Chest Port 1 View  Final Result    MR ABDOMEN MRCP W WO CONTAST    (Results Pending)    Medications  sodium chloride flush (NS) 0.9 % injection 3 mL (has no administration in time range)  0.9 %  sodium chloride infusion ( Intravenous New Bag/Given 09/18/19 2235)  HYDROmorphone (DILAUDID) injection 1 mg (1 mg Intravenous Given 09/18/19 2234)  piperacillin-tazobactam (ZOSYN) IVPB 3.375 g (3.375 g Intravenous New Bag/Given 09/18/19 2235)  pantoprazole  (PROTONIX) injection 40 mg (40 mg Intravenous Given 09/18/19 2253)  metoCLOPramide (REGLAN) injection 5 mg (has no administration in time range)  fentaNYL (SUBLIMAZE) injection 100 mcg (100 mcg Intravenous Given 09/18/19 1740)  ondansetron (ZOFRAN) injection 4 mg (4 mg Intravenous Given 09/18/19 1753)  iohexol (OMNIPAQUE) 350 MG/ML injection 100 mL (100 mLs Intravenous Contrast Given 09/18/19 1841)  fentaNYL (SUBLIMAZE) injection 50 mcg (50 mcg Intravenous Given 09/18/19 1925)  haloperidol lactate (HALDOL) injection 2 mg (  2 mg Intravenous Given 09/18/19 1952)     Procedures  /  Critical Care Procedures  ED Course and Medical Decision Making  I have reviewed the triage vital signs, the nursing notes, and pertinent available records from the EMR.  Pertinent labs & imaging results that were available during my care of the patient were reviewed by me and considered in my medical decision making (see below for details).     Initial concern for vascular phenomenon given patient's acute severe pain.  Mesenteric ischemia due to embolism, aortic dissection, ruptured AAA, also considering perforated viscus.  Hemodynamically stable, mildly tachycardic but in severe discomfort.  Will need CTA dissection protocol to exclude these pathologies.  CTA is without vascular emergency, does reveal cholelithiasis.  Patient is continued severe pain, right upper quadrant ultrasound obtained for further clarification, question of sonographic Murphy's sign, question of dilated CBD, however patient's pain is largely in the periumbilical or suprapubic region.  Doubt acute cholecystitis, but patient will need admission for further work-up, pain control, and possible MRCP to exclude choledocholithiasis.  Admitted to hospital service for further care.  Barth Kirks. [place] Small, [place] [email or web address]  Final Clinical Impressions(s) / ED Diagnoses     ICD-10-CM   1.  Cholelithiasis  K80.20 US Abdomen Limited RUQ    US Abdomen Limited RUQ    ED Discharge Orders    None       Discharge Instructions Discussed with and Provided to Patient:   Discharge Instructions   None       Maudie Flakes, MD 09/18/19 2311

## 2019-09-18 NOTE — H&P (Signed)
History and Physical  Tara Tanner [id number] DOB: November 26, 1965 DOA: 09/18/2019  Referring physician: ER provider PCP: Pleas Koch, NP  Outpatient Specialists:    Patient coming from: Home  Chief Complaint: Abdominal pain, nausea and vomiting  HPI:  Patient is a 54 year old Caucasian female with past medical history significant for obesity.  Patient presents with history of severe lower abdominal pain that started earlier today (around 2 PM).  Pain is said to be constant, rated 10 out of 10, sharp and radiating to the epigastric area.  No prior documentation of GERD.  Associated nausea and vomiting.  No fever or chills, no headache, no neck pain, no URI symptoms, no chest pain, no shortness of breath and no urinary symptoms.  CT of the chest, abdomen and pelvis came back negative for dissection, but revealed cholelithiasis and noninflamed colonic diverticula.  Right upper quadrant ultrasound revealed cholelithiasis with positive Murphy sign.  Leukocytosis of 11.8, serum creatinine of 1.06 (up from 0.75) and lactic acid of 2.2 reported.  Glucose is 151.  The hospitalist team has been asked to admit patient for further assessment and management.  ED Course: On presentation to the ER, temperature was 97.9, blood pressure of 155/99, heart rate of 109, respiratory rate of 17 and O2 sat of 100%.  Patient seems to have responded to IV fentanyl.  Patient is reporting 9 out of 10 pain, but looks solidly comfortable.  Pertinent labs: As documented above.  EKG: Independently reviewed.   Imaging: independently reviewed.   Review of Systems:  Negative for fever, visual changes, sore throat, rash, new muscle aches, chest pain, SOB, dysuria, bleeding.  Past Medical History:  Diagnosis Date  . Allergy   . Obesity     Past Surgical History:  Procedure Laterality Date  . ABDOMINAL HYSTERECTOMY  06/2008   vag hys secondary to Uterine leiomyomata  . CESAREAN SECTION  2001  . DILATION AND  CURETTAGE, DIAGNOSTIC / THERAPEUTIC    . MYOMECTOMY    . TONSILLECTOMY       reports that she has never smoked. She has never used smokeless tobacco. She reports that she does not drink alcohol or use drugs.  Allergies  Allergen Reactions  . Codeine Nausea And Vomiting and Other (See Comments)    Hallucinations    Family History  Problem Relation Age of Onset  . Stroke Mother   . Alzheimer's disease Mother   . Kidney cancer Father   . Rheum arthritis Father   . Colon cancer Neg Hx   . Esophageal cancer Neg Hx   . Stomach cancer Neg Hx   . Rectal cancer Neg Hx      Prior to Admission medications   Not on File    Physical Exam: Vitals:   09/18/19 1703  BP: (!) 155/99  Pulse: (!) 108  Resp: 17  Temp: 97.9 F (36.6 C)  TempSrc: Oral  SpO2: 100%    Constitutional:  . Appears calm and comfortable.  Patient is morbidly obese. Eyes:  . No pallor. No jaundice.  ENMT:  . external ears, nose appear normal Neck:  . Neck is supple. No JVD Respiratory:  . CTA bilaterally, no w/r/r.  . Respiratory effort normal. No retractions or accessory muscle use Cardiovascular:  . S1S2 . No LE extremity edema   Abdomen:  . Abdomen is morbidly obese, soft and non tender. Organs are difficult to assess. Neurologic:  . Awake and alert. . Moves all limbs.  Wt Readings from Last  3 Encounters:  07/02/19 105 kg  08/25/18 109.1 kg  08/11/18 111.1 kg    I have personally reviewed following labs and imaging studies  Labs on Admission:  CBC: Recent Labs  Lab 09/18/19 1725  WBC 11.8*  HGB 14.6  HCT 43.6  MCV 87.0  PLT 123456   Basic Metabolic Panel: Recent Labs  Lab 09/18/19 1725  NA 141  K 4.5  CL 108  CO2 22  GLUCOSE 151*  BUN 19  CREATININE 1.06*  CALCIUM 10.2   Liver Function Tests: Recent Labs  Lab 09/18/19 1725  AST 31  ALT 27  ALKPHOS 94  BILITOT 1.0  PROT 7.6  ALBUMIN 4.4   Recent Labs  Lab 09/18/19 1725  LIPASE 26   No results for  input(s): AMMONIA in the last 168 hours. Coagulation Profile: No results for input(s): INR, PROTIME in the last 168 hours. Cardiac Enzymes: No results for input(s): CKTOTAL, CKMB, CKMBINDEX, TROPONINI in the last 168 hours. BNP (last 3 results) No results for input(s): PROBNP in the last 8760 hours. HbA1C: No results for input(s): HGBA1C in the last 72 hours. CBG: No results for input(s): GLUCAP in the last 168 hours. Lipid Profile: No results for input(s): CHOL, HDL, LDLCALC, TRIG, CHOLHDL, LDLDIRECT in the last 72 hours. Thyroid Function Tests: No results for input(s): TSH, T4TOTAL, FREET4, T3FREE, THYROIDAB in the last 72 hours. Anemia Panel: No results for input(s): VITAMINB12, FOLATE, FERRITIN, TIBC, IRON, RETICCTPCT in the last 72 hours. Urine analysis: No results found for: COLORURINE, APPEARANCEUR, LABSPEC, PHURINE, GLUCOSEU, HGBUR, BILIRUBINUR, KETONESUR, PROTEINUR, UROBILINOGEN, NITRITE, LEUKOCYTESUR Sepsis Labs: @LABRCNTIP (procalcitonin:4,lacticidven:4) )No results found for this or any previous visit (from the past 240 hour(s)).    Radiological Exams on Admission: DG Chest Port 1 View  Result Date: 09/18/2019 CLINICAL DATA:  Pain EXAM: PORTABLE CHEST 1 VIEW COMPARISON:  September 08, 2010 FINDINGS: Lungs are clear. Heart is upper normal in size with pulmonary vascularity normal. No adenopathy. No pneumothorax. No bone lesions. IMPRESSION: Lungs clear.  Heart upper normal in size.  No adenopathy. Electronically Signed   By: Lowella Grip III M.D.   On: 09/18/2019 18:32   CT Angio Chest/Abd/Pel for Dissection W and/or Wo Contrast  Result Date: 09/18/2019 CLINICAL DATA:  Evaluate for aortic dissection. EXAM: CT ANGIOGRAPHY CHEST, ABDOMEN AND PELVIS TECHNIQUE: Multidetector CT imaging through the chest, abdomen and pelvis was performed using the standard protocol during bolus administration of intravenous contrast. Multiplanar reconstructed images and MIPs were obtained and  reviewed to evaluate the vascular anatomy. CONTRAST:  185mL OMNIPAQUE IOHEXOL 350 MG/ML SOLN COMPARISON:  None. FINDINGS: CTA CHEST FINDINGS Cardiovascular: Satisfactory opacification of the pulmonary arteries to the segmental level. No evidence of pulmonary embolism. Normal heart size. No pericardial effusion. Mediastinum/Nodes: No enlarged mediastinal, hilar, or axillary lymph nodes. Thyroid gland, trachea, and esophagus demonstrate no significant findings. Lungs/Pleura: Lungs are clear. No pleural effusion or pneumothorax. Musculoskeletal: No chest wall abnormality. No acute or significant osseous findings. Review of the MIP images confirms the above findings. CTA ABDOMEN AND PELVIS FINDINGS VASCULAR Aorta: Normal caliber aorta without aneurysm, dissection, vasculitis or significant stenosis. Celiac: Patent without evidence of aneurysm, dissection, vasculitis or significant stenosis. SMA: Patent without evidence of aneurysm, dissection, vasculitis or significant stenosis. Renals: Both renal arteries are patent without evidence of aneurysm, dissection, vasculitis, fibromuscular dysplasia or significant stenosis. IMA: Patent without evidence of aneurysm, dissection, vasculitis or significant stenosis. Inflow: Patent without evidence of aneurysm, dissection, vasculitis or significant stenosis. Veins: No obvious  venous abnormality within the limitations of this arterial phase study. Review of the MIP images confirms the above findings. NON-VASCULAR Hepatobiliary: No focal liver abnormality is seen. There is mild diffuse fatty infiltration of the liver parenchyma. Small, ill-defined gallstones are seen within the lumen of an otherwise normal-appearing gallbladder. Pancreas: Unremarkable. No pancreatic ductal dilatation or surrounding inflammatory changes. Spleen: Normal in size without focal abnormality. Adrenals/Urinary Tract: Adrenal glands are unremarkable. Kidneys are normal, without renal calculi, focal lesion,  or hydronephrosis. Bladder is unremarkable. Stomach/Bowel: There is a small hiatal hernia. Appendix appears normal. No evidence of bowel wall thickening, distention, or inflammatory changes. Noninflamed diverticula seen throughout the large bowel. Lymphatic: Aortic atherosclerosis. No enlarged abdominal or pelvic lymph nodes. Reproductive: Status post hysterectomy. No adnexal masses. Other: No abdominal wall hernia or abnormality. No abdominopelvic ascites. Musculoskeletal: No acute or significant osseous findings. Review of the MIP images confirms the above findings. IMPRESSION: 1. No evidence of aortic dissection or aneurysm. 2. No evidence of pulmonary embolism. 3. Mild diffuse fatty infiltration of the liver parenchyma. 4. Cholelithiasis without evidence of acute cholecystitis. 5. Small hiatal hernia. 6. Noninflamed diverticula throughout the large bowel. Electronically Signed   By: Virgina Norfolk M.D.   On: 09/18/2019 18:49   US Abdomen Limited RUQ  Result Date: 09/18/2019 CLINICAL DATA:  Right upper quadrant pain EXAM: ULTRASOUND ABDOMEN LIMITED RIGHT UPPER QUADRANT COMPARISON:  CT 09/18/2019 FINDINGS: Gallbladder: Multiple shadowing stones within the gallbladder. Normal wall thickness. Positive sonographic Murphy. Common bile duct: Diameter: 10 mm Liver: Liver is echogenic. No focal hepatic abnormality. Portal vein is patent on color Doppler imaging with normal direction of blood flow towards the liver. Other: None. IMPRESSION: 1. Cholelithiasis with positive sonographic Murphy. Although wall thickness is normal and no pericholecystic fluid is seen, findings to raise concern for an acute cholecystitis. Common bile duct is dilated up to 1 cm, recommend correlation with LFTs. If ductal obstruction is a concern, further evaluation with MRCP would be recommended. 2. Echogenic liver consistent with steatosis. Electronically Signed   By: Donavan Foil M.D.   On: 09/18/2019 20:17    EKG: Independently  reviewed.   Active Problems:   Abdominal pain   Assessment/Plan Abdominal pain/cholelithiasis/positive Percell Miller sign: -Admit patient for further assessment and management. -MRI of the abdomen, and consider HIDA scan. -Adequate hydration -IV antibiotics -Low threshold to consult GI and surgical team.   Volume depletion: -Aggressively hydrate patient, especially, considering contrast dye exposure.  Mild AKI: Likely secondary to volume depletion Monitor serum creatinine  Elevated blood pressure: May be secondary to reported pain. Monitor closely.  Elevated blood sugar: Check HbA1c Monitor blood sugar to rule out possibly underlying diabetes mellitus  Morbid obesity: Diet and exercise Further management on outpatient basis  DVT prophylaxis: SCD Code Status: Full code Family Communication:  Disposition Plan: Home eventually Consults called: Low threshold to consult GI and surgical team Admission status: Inpatient  Time spent: 65 minutes  Dana Allan, MD  [facility] Pager #: [phone] 7PM-7AM contact night coverage as above  09/18/2019, 9:26 PM

## 2019-09-18 NOTE — ED Triage Notes (Addendum)
Started vomiting at approx 1430 this afternoon-- had 1 episode of diarrhea-- writhing in pain at triage. Crying- clammy/pale--

## 2019-09-19 ENCOUNTER — Inpatient Hospital Stay (HOSPITAL_COMMUNITY): Payer: BC Managed Care – PPO

## 2019-09-19 ENCOUNTER — Other Ambulatory Visit: Payer: Self-pay

## 2019-09-19 ENCOUNTER — Encounter (HOSPITAL_COMMUNITY): Payer: Self-pay | Admitting: *Deleted

## 2019-09-19 DIAGNOSIS — K760 Fatty (change of) liver, not elsewhere classified: Secondary | ICD-10-CM

## 2019-09-19 DIAGNOSIS — R112 Nausea with vomiting, unspecified: Secondary | ICD-10-CM

## 2019-09-19 DIAGNOSIS — K8020 Calculus of gallbladder without cholecystitis without obstruction: Principal | ICD-10-CM

## 2019-09-19 DIAGNOSIS — R1030 Lower abdominal pain, unspecified: Secondary | ICD-10-CM

## 2019-09-19 LAB — GLUCOSE, CAPILLARY
Glucose-Capillary: 160 mg/dL — ABNORMAL HIGH (ref 70–99)
Glucose-Capillary: 108 mg/dL — ABNORMAL HIGH (ref 70–99)
Glucose-Capillary: 85 mg/dL (ref 70–99)

## 2019-09-19 LAB — BASIC METABOLIC PANEL
Anion gap: 12 (ref 5–15)
BUN: 17 mg/dL (ref 6–20)
CO2: 24 mmol/L (ref 22–32)
Calcium: 9.7 mg/dL (ref 8.9–10.3)
Chloride: 105 mmol/L (ref 98–111)
Creatinine, Ser: 0.91 mg/dL (ref 0.44–1.00)
GFR calc Af Amer: >60 mL/min (ref >60)
GFR calc non Af Amer: >60 mL/min (ref >60)
Glucose, Bld: 153 mg/dL — ABNORMAL HIGH (ref 70–99)
Potassium: 4.3 mmol/L (ref 3.5–5.1)
Sodium: 141 mmol/L (ref 135–145)

## 2019-09-19 LAB — URINALYSIS, ROUTINE W REFLEX MICROSCOPIC
Bilirubin Urine: NEGATIVE
Glucose, UA: NEGATIVE mg/dL
Ketones, ur: NEGATIVE mg/dL
Leukocytes,Ua: NEGATIVE
Nitrite: NEGATIVE
Protein, ur: NEGATIVE mg/dL
Specific Gravity, Urine: >1.046 — ABNORMAL HIGH (ref 1.005–1.030)
pH: 5 (ref 5.0–8.0)

## 2019-09-19 LAB — CBC
HCT: 40.6 % (ref 36.0–46.0)
Hemoglobin: 13.9 g/dL (ref 12.0–15.0)
MCH: 29.4 pg (ref 26.0–34.0)
MCHC: 34.2 g/dL (ref 30.0–36.0)
MCV: 85.8 fL (ref 80.0–100.0)
Platelets: 323 10*3/uL (ref 150–400)
RBC: 4.73 MIL/uL (ref 3.87–5.11)
RDW: 12.6 % (ref 11.5–15.5)
WBC: 13.2 10*3/uL — ABNORMAL HIGH (ref 4.0–10.5)
nRBC: 0 % (ref 0.0–0.2)

## 2019-09-19 LAB — RAPID URINE DRUG SCREEN, HOSP PERFORMED
Amphetamines: NOT DETECTED
Barbiturates: NOT DETECTED
Benzodiazepines: NOT DETECTED
Cocaine: NOT DETECTED
Opiates: POSITIVE — AB
Tetrahydrocannabinol: NOT DETECTED

## 2019-09-19 LAB — RESPIRATORY PANEL BY RT PCR (FLU A&B, COVID)
Influenza A by PCR: NEGATIVE
Influenza B by PCR: NEGATIVE
SARS Coronavirus 2 by RT PCR: NEGATIVE

## 2019-09-19 MED ORDER — GADOBUTROL 1 MMOL/ML IV SOLN
10.0000 mL | Freq: Once | INTRAVENOUS | Status: AC | PRN
  Administered 2019-09-19: 01:00:00 10 mL via INTRAVENOUS

## 2019-09-19 NOTE — Progress Notes (Signed)
Discharge  Pt was able to participate with discharge teaching with spouse at the bedside. No new medications. PIV removed and pt was able to dress with the assistance of husband. Pt informed of all follow up appts. Pt instructed to call primary if pain returns. Pt requested wheelchair for d/c. Pt complains of no pain at time of discharge. Pt was able to eat some noodles and a couple bites of garlic bread successfully.

## 2019-09-19 NOTE — Discharge Summary (Signed)
Physician Discharge Summary  Tara Tanner [id number] DOB: 09-Jan-1966 DOA: 09/18/2019  PCP: Pleas Koch, NP  Admit date: 09/18/2019 Discharge date: 09/19/2019  Admitted From: Home Disposition: Home  Recommendations for Outpatient Follow-up:  1. Follow ups as below. 2. Please obtain CBC/BMP/Mag at follow up 3. Please follow up on the following pending results: None  Home Health: None Equipment/Devices: None  Discharge Condition: Stable CODE STATUS: Full code   Hospital Course: 54 year old female with no significant past medical history presenting with acute low abdominal pain for 1 day associated with nausea and vomiting.  In ED, hemodynamically stable.  CBC with mild leukocytosis to 11.8 with bandemia.  Creatinine 1.06 (baseline 0.75).  Lactic acid 2.2 but improved with hydration.  She had no elevated liver enzymes.  CT abdomen and pelvis without significant finding other than cholelithiasis without evidence of cholecystitis.  RUQ ultrasound Confirmed cholelithiasis but no cholecystitis.  She had CBD to 10 mm.  MRCP with cholelithiasis but no choledocholithiasis. Patient was managed symptomatically with bowel rest and pain medication.    The next day, patient's abdominal pain resolved.  She tolerated soft diet without problem. Discharged home to follow-up with general surgery outpatient in 2 to 3 weeks.   Discharge Diagnoses:  Nausea/vomiting/abdominal pain: unclear etiology but resolved.  Imaging including CT abdomen and pelvis, RUQ [place] and MRCP revealed cholelithiasis without evidence of cholecystitis or choledocholithiasis.  She has no transaminitis.  It is possible that she could have passed a small stone.  She tolerated soft diet without an issue on the day of discharge.  Patient to follow-up with general surgery outpatient.  Hepatic steatosis -Encourage lifestyle change to lose weight.  Discharge Instructions  Discharge Instructions    Call MD for:  extreme  fatigue   Complete by: As directed    Call MD for:  persistant dizziness or light-headedness   Complete by: As directed    Call MD for:  persistant nausea and vomiting   Complete by: As directed    Call MD for:  severe uncontrolled pain   Complete by: As directed    Diet - low sodium heart healthy   Complete by: As directed    Discharge instructions   Complete by: As directed    It has been a pleasure taking care of you! You were hospitalized and treated for abdominal pain.  After the test is we have done so far, it is unclear what could have caused you abdominal pain.  The ultrasound, CT scan and MRI revealed gallbladder stone which could have contributed to your pain.  It could also be due to viral gastroenteritis (inflammation due to some viral infection).  Your symptoms improved to the point we think it is safe to let you go home and follow-up with general surgery to discuss about gallbladder stone. Please review your new medication list and the directions before you take your medications.   Take care,   Increase activity slowly   Complete by: As directed      Allergies as of 09/19/2019      Reactions   Codeine Nausea And Vomiting, Other (See Comments)   Hallucinations, also      Medication List    TAKE these medications   acetaminophen 500 MG tablet Commonly known as: TYLENOL Take 500-1,000 mg by mouth every 6 (six) hours as needed for headache.   ibuprofen 200 MG tablet Commonly known as: ADVIL Take 400 mg by mouth every 6 (six) hours as needed for  headache.       Consultations:  None  Procedures/Studies:   MR 3D Recon At Scanner  Result Date: 09/19/2019 CLINICAL DATA:  Cholelithiasis and positive sonographic Murphy's sign elevated white blood cell count EXAM: MRI ABDOMEN WITHOUT AND WITH CONTRAST (INCLUDING MRCP) TECHNIQUE: Multiplanar multisequence MR imaging of the abdomen was performed both before and after the administration of intravenous contrast.  Heavily T2-weighted images of the biliary and pancreatic ducts were obtained, and three-dimensional MRCP images were rendered by post processing. CONTRAST:  62mL GADAVIST GADOBUTROL 1 MMOL/ML IV SOLN COMPARISON:  Ultrasound same day FINDINGS: Lower chest:  Lung bases are clear. Hepatobiliary: Multiple small gallstones fill the lumen of the gallbladder. Stones measure 8-10 mm and number approximately 10-12 stones. No clear gallbladder wall thickening. No hyperemia. Gallbladder is mildly distended to 3.9 cm. No pericholecystic fluid. Common bile duct is normal caliber. No filling defect within the common bile duct No focal hepatic lesion.  Hepatic steatosis noted. Pancreas: Normal pancreatic parenchymal intensity. No ductal dilatation or inflammation. Spleen: Normal spleen. Adrenals/urinary tract: Adrenal glands and kidneys are normal. Stomach/Bowel: Stomach and limited of the small bowel is unremarkable Vascular/Lymphatic: Abdominal aortic normal caliber. No retroperitoneal periportal lymphadenopathy. Musculoskeletal: No aggressive osseous lesion IMPRESSION: 1. Cholelithiasis without clear evidence cholecystitis. 2. No choledocholithiasis. 3. Hepatic steatosis. 4. Normal pancreas. Electronically Signed   By: Suzy Bouchard M.D.   On: 09/19/2019 07:27   DG Chest Port 1 View  Result Date: 09/18/2019 CLINICAL DATA:  Pain EXAM: PORTABLE CHEST 1 VIEW COMPARISON:  September 08, 2010 FINDINGS: Lungs are clear. Heart is upper normal in size with pulmonary vascularity normal. No adenopathy. No pneumothorax. No bone lesions. IMPRESSION: Lungs clear.  Heart upper normal in size.  No adenopathy. Electronically Signed   By: Lowella Grip III M.D.   On: 09/18/2019 18:32   MR ABDOMEN MRCP W WO CONTAST  Result Date: 09/19/2019 CLINICAL DATA:  Cholelithiasis and positive sonographic Murphy's sign elevated white blood cell count EXAM: MRI ABDOMEN WITHOUT AND WITH CONTRAST (INCLUDING MRCP) TECHNIQUE: Multiplanar  multisequence MR imaging of the abdomen was performed both before and after the administration of intravenous contrast. Heavily T2-weighted images of the biliary and pancreatic ducts were obtained, and three-dimensional MRCP images were rendered by post processing. CONTRAST:  6mL GADAVIST GADOBUTROL 1 MMOL/ML IV SOLN COMPARISON:  Ultrasound same day FINDINGS: Lower chest:  Lung bases are clear. Hepatobiliary: Multiple small gallstones fill the lumen of the gallbladder. Stones measure 8-10 mm and number approximately 10-12 stones. No clear gallbladder wall thickening. No hyperemia. Gallbladder is mildly distended to 3.9 cm. No pericholecystic fluid. Common bile duct is normal caliber. No filling defect within the common bile duct No focal hepatic lesion.  Hepatic steatosis noted. Pancreas: Normal pancreatic parenchymal intensity. No ductal dilatation or inflammation. Spleen: Normal spleen. Adrenals/urinary tract: Adrenal glands and kidneys are normal. Stomach/Bowel: Stomach and limited of the small bowel is unremarkable Vascular/Lymphatic: Abdominal aortic normal caliber. No retroperitoneal periportal lymphadenopathy. Musculoskeletal: No aggressive osseous lesion IMPRESSION: 1. Cholelithiasis without clear evidence cholecystitis. 2. No choledocholithiasis. 3. Hepatic steatosis. 4. Normal pancreas. Electronically Signed   By: Suzy Bouchard M.D.   On: 09/19/2019 07:27   CT Angio Chest/Abd/Pel for Dissection W and/or Wo Contrast  Result Date: 09/18/2019 CLINICAL DATA:  Evaluate for aortic dissection. EXAM: CT ANGIOGRAPHY CHEST, ABDOMEN AND PELVIS TECHNIQUE: Multidetector CT imaging through the chest, abdomen and pelvis was performed using the standard protocol during bolus administration of intravenous contrast. Multiplanar reconstructed images and MIPs  were obtained and reviewed to evaluate the vascular anatomy. CONTRAST:  133mL OMNIPAQUE IOHEXOL 350 MG/ML SOLN COMPARISON:  None. FINDINGS: CTA CHEST FINDINGS  Cardiovascular: Satisfactory opacification of the pulmonary arteries to the segmental level. No evidence of pulmonary embolism. Normal heart size. No pericardial effusion. Mediastinum/Nodes: No enlarged mediastinal, hilar, or axillary lymph nodes. Thyroid gland, trachea, and esophagus demonstrate no significant findings. Lungs/Pleura: Lungs are clear. No pleural effusion or pneumothorax. Musculoskeletal: No chest wall abnormality. No acute or significant osseous findings. Review of the MIP images confirms the above findings. CTA ABDOMEN AND PELVIS FINDINGS VASCULAR Aorta: Normal caliber aorta without aneurysm, dissection, vasculitis or significant stenosis. Celiac: Patent without evidence of aneurysm, dissection, vasculitis or significant stenosis. SMA: Patent without evidence of aneurysm, dissection, vasculitis or significant stenosis. Renals: Both renal arteries are patent without evidence of aneurysm, dissection, vasculitis, fibromuscular dysplasia or significant stenosis. IMA: Patent without evidence of aneurysm, dissection, vasculitis or significant stenosis. Inflow: Patent without evidence of aneurysm, dissection, vasculitis or significant stenosis. Veins: No obvious venous abnormality within the limitations of this arterial phase study. Review of the MIP images confirms the above findings. NON-VASCULAR Hepatobiliary: No focal liver abnormality is seen. There is mild diffuse fatty infiltration of the liver parenchyma. Small, ill-defined gallstones are seen within the lumen of an otherwise normal-appearing gallbladder. Pancreas: Unremarkable. No pancreatic ductal dilatation or surrounding inflammatory changes. Spleen: Normal in size without focal abnormality. Adrenals/Urinary Tract: Adrenal glands are unremarkable. Kidneys are normal, without renal calculi, focal lesion, or hydronephrosis. Bladder is unremarkable. Stomach/Bowel: There is a small hiatal hernia. Appendix appears normal. No evidence of bowel wall  thickening, distention, or inflammatory changes. Noninflamed diverticula seen throughout the large bowel. Lymphatic: Aortic atherosclerosis. No enlarged abdominal or pelvic lymph nodes. Reproductive: Status post hysterectomy. No adnexal masses. Other: No abdominal wall hernia or abnormality. No abdominopelvic ascites. Musculoskeletal: No acute or significant osseous findings. Review of the MIP images confirms the above findings. IMPRESSION: 1. No evidence of aortic dissection or aneurysm. 2. No evidence of pulmonary embolism. 3. Mild diffuse fatty infiltration of the liver parenchyma. 4. Cholelithiasis without evidence of acute cholecystitis. 5. Small hiatal hernia. 6. Noninflamed diverticula throughout the large bowel. Electronically Signed   By: Virgina Norfolk M.D.   On: 09/18/2019 18:49   US Abdomen Limited RUQ  Result Date: 09/18/2019 CLINICAL DATA:  Right upper quadrant pain EXAM: ULTRASOUND ABDOMEN LIMITED RIGHT UPPER QUADRANT COMPARISON:  CT 09/18/2019 FINDINGS: Gallbladder: Multiple shadowing stones within the gallbladder. Normal wall thickness. Positive sonographic Murphy. Common bile duct: Diameter: 10 mm Liver: Liver is echogenic. No focal hepatic abnormality. Portal vein is patent on color Doppler imaging with normal direction of blood flow towards the liver. Other: None. IMPRESSION: 1. Cholelithiasis with positive sonographic Murphy. Although wall thickness is normal and no pericholecystic fluid is seen, findings to raise concern for an acute cholecystitis. Common bile duct is dilated up to 1 cm, recommend correlation with LFTs. If ductal obstruction is a concern, further evaluation with MRCP would be recommended. 2. Echogenic liver consistent with steatosis. Electronically Signed   By: Donavan Foil M.D.   On: 09/18/2019 20:17       Discharge Exam: Vitals:   09/19/19 0129 09/19/19 0553  BP: 134/66 (!) 116/49  Pulse: (!) 54 (!) 53  Resp: 18 18  Temp: 97.6 F (36.4 C) 98.1 F (36.7  C)  SpO2: 98% 100%    GENERAL: No acute distress.  Appears well.  HEENT: MMM.  Vision and hearing grossly intact.  NECK: Supple.  No apparent JVD.  RESP:  No IWOB. Good air movement bilaterally. CVS:  RRR. Heart sounds normal.  ABD/GI/GU: Bowel sounds present. Soft. Non tender.  MSK/EXT:  Moves extremities. No apparent deformity or edema.  SKIN: no apparent skin lesion or wound NEURO: Awake, alert and oriented appropriately.  No apparent focal neuro deficit. PSYCH: Calm. Normal affect.   The results of significant diagnostics from this hospitalization (including imaging, microbiology, ancillary and laboratory) are listed below for reference.     Microbiology: Recent Results (from the past 240 hour(s))  Respiratory Panel by RT PCR (Flu A&B, Covid) - Nasopharyngeal Swab     Status: None   Collection Time: 09/18/19 11:03 PM   Specimen: Nasopharyngeal Swab  Result Value Ref Range Status   SARS Coronavirus 2 by RT PCR NEGATIVE NEGATIVE Final    Comment: (NOTE) SARS-CoV-2 target nucleic acids are NOT DETECTED. The SARS-CoV-2 RNA is generally detectable in upper respiratoy specimens during the acute phase of infection. The lowest concentration of SARS-CoV-2 viral copies this assay can detect is 131 copies/mL. A negative result does not preclude SARS-Cov-2 infection and should not be used as the sole basis for treatment or other patient management decisions. A negative result may occur with  improper specimen collection/handling, submission of specimen other than nasopharyngeal swab, presence of viral mutation(s) within the areas targeted by this assay, and inadequate number of viral copies (<131 copies/mL). A negative result must be combined with clinical observations, patient history, and epidemiological information. The expected result is Negative. Fact Sheet for Patients:  PinkCheek.be Fact Sheet for Healthcare Providers:    GravelBags.it This test is not yet ap proved or cleared by the [place] FDA and  has been authorized for detection and/or diagnosis of SARS-CoV-2 by FDA under an Emergency Use Authorization (EUA). This EUA will remain  in effect (meaning this test can be used) for the duration of the COVID-19 declaration under Section 564(b)(1) of the Act, 21 U.S.C. section 360bbb-3(b)(1), unless the authorization is terminated or revoked sooner.    Influenza A by PCR NEGATIVE NEGATIVE Final   Influenza B by PCR NEGATIVE NEGATIVE Final    Comment: (NOTE) The Xpert Xpress SARS-CoV-2/FLU/RSV assay is intended as an aid in  the diagnosis of influenza from Nasopharyngeal swab specimens and  should not be used as a sole basis for treatment. Nasal washings and  aspirates are unacceptable for Xpert Xpress SARS-CoV-2/FLU/RSV  testing. Fact Sheet for Patients: PinkCheek.be Fact Sheet for Healthcare Providers: GravelBags.it This test is not yet approved or cleared by the [place] FDA and  has been authorized for detection and/or diagnosis of SARS-CoV-2 by  FDA under an Emergency Use Authorization (EUA). This EUA will remain  in effect (meaning this test can be used) for the duration of the  Covid-19 declaration under Section 564(b)(1) of the Act, 21  U.S.C. section 360bbb-3(b)(1), unless the authorization is  terminated or revoked. Performed at [facility], [place] [address] [place], [place] [address]      Labs: BNP (last 3 results) No results for input(s): BNP in the last 8760 hours. Basic Metabolic Panel: Recent Labs  Lab 09/18/19 1725 09/18/19 1737 09/19/19 0350  NA 141  --  141  K 4.5  --  4.3  CL 108  --  105  CO2 22  --  24  GLUCOSE 151*  --  153*  BUN 19  --  17  CREATININE 1.06*  --  0.91  CALCIUM  10.2  --  9.7  MG  --  2.2  --   PHOS  --  2.3*  --    Liver Function Tests: Recent  Labs  Lab 09/18/19 1725  AST 31  ALT 27  ALKPHOS 94  BILITOT 1.0  PROT 7.6  ALBUMIN 4.4   Recent Labs  Lab 09/18/19 1725  LIPASE 26   No results for input(s): AMMONIA in the last 168 hours. CBC: Recent Labs  Lab 09/18/19 1725 09/19/19 0350  WBC 11.8* 13.2*  HGB 14.6 13.9  HCT 43.6 40.6  MCV 87.0 85.8  PLT 388 323   Cardiac Enzymes: No results for input(s): CKTOTAL, CKMB, CKMBINDEX, TROPONINI in the last 168 hours. BNP: Invalid input(s): POCBNP CBG: Recent Labs  Lab 09/19/19 0205 09/19/19 0837  GLUCAP 160* 108*   D-Dimer No results for input(s): DDIMER in the last 72 hours. Hgb A1c Recent Labs    09/18/19 1725  HGBA1C 5.0   Lipid Profile No results for input(s): CHOL, HDL, LDLCALC, TRIG, CHOLHDL, LDLDIRECT in the last 72 hours. Thyroid function studies Recent Labs    09/18/19 1737  TSH 0.425   Anemia work up No results for input(s): VITAMINB12, FOLATE, FERRITIN, TIBC, IRON, RETICCTPCT in the last 72 hours. Urinalysis    Component Value Date/Time   COLORURINE YELLOW 09/19/2019 0843   APPEARANCEUR TURBID (A) 09/19/2019 0843   LABSPEC >1.046 (H) 09/19/2019 0843   PHURINE 5.0 09/19/2019 0843   GLUCOSEU NEGATIVE 09/19/2019 0843   HGBUR SMALL (A) 09/19/2019 0843   BILIRUBINUR NEGATIVE 09/19/2019 0843   KETONESUR NEGATIVE 09/19/2019 0843   PROTEINUR NEGATIVE 09/19/2019 0843   NITRITE NEGATIVE 09/19/2019 0843   LEUKOCYTESUR NEGATIVE 09/19/2019 0843   Sepsis Labs Invalid input(s): PROCALCITONIN,  WBC,  LACTICIDVEN   Time coordinating discharge: 25 minutes  SIGNED:  Mercy Riding, MD  [facility] 09/19/2019, 11:03 AM  If 7PM-7AM, please contact night-coverage [email or web address] Password [email or web address]

## 2019-09-19 NOTE — ED Notes (Signed)
Pt transported to MRI 

## 2019-09-19 NOTE — Plan of Care (Signed)

## 2019-09-19 NOTE — Discharge Instructions (Signed)
Cholelithiasis  Cholelithiasis is also called "gallstones." It is a kind of gallbladder disease. The gallbladder is an organ that stores a liquid (bile) that helps you digest fat. Gallstones may not cause symptoms (may be silent gallstones) until they cause a blockage, and then they can cause pain (gallbladder attack). Follow these instructions at home:  Take over-the-counter and prescription medicines only as told by your doctor.  Stay at a healthy weight.  Eat healthy foods. This includes: ? Eating fewer fatty foods, like fried foods. ? Eating fewer refined carbs (refined carbohydrates). Refined carbs are breads and grains that are highly processed, like white bread and white rice. Instead, choose whole grains like whole-wheat bread and brown rice. ? Eating more fiber. Almonds, fresh fruit, and beans are healthy sources of fiber.  Keep all follow-up visits as told by your doctor. This is important. Contact a doctor if:  You have sudden pain in the upper right side of your belly (abdomen). Pain might spread to your right shoulder or your chest. This may be a sign of a gallbladder attack.  You feel sick to your stomach (are nauseous).  You throw up (vomit).  You have been diagnosed with gallstones that have no symptoms and you get: ? Belly pain. ? Discomfort, burning, or fullness in the upper part of your belly (indigestion). Get help right away if:  You have sudden pain in the upper right side of your belly, and it lasts for more than 2 hours.  You have belly pain that lasts for more than 5 hours.  You have a fever or chills.  You keep feeling sick to your stomach or you keep throwing up.  Your skin or the whites of your eyes turn yellow (jaundice).  You have dark-colored pee (urine).  You have light-colored poop (stool). Summary  Cholelithiasis is also called "gallstones."  The gallbladder is an organ that stores a liquid (bile) that helps you digest fat.  Silent  gallstones are gallstones that do not cause symptoms.  A gallbladder attack may cause sudden pain in the upper right side of your belly. Pain might spread to your right shoulder or your chest. If this happens, contact your doctor.  If you have sudden pain in the upper right side of your belly that lasts for more than 2 hours, get help right away. This information is not intended to replace advice given to you by your health care provider. Make sure you discuss any questions you have with your health care provider. Document Revised: 07/29/2017 Document Reviewed: 05/02/2016 Elsevier Patient Education  2020 [employer]vier Inc.  

## 2019-09-20 ENCOUNTER — Telehealth: Payer: Self-pay

## 2019-09-20 DIAGNOSIS — K8020 Calculus of gallbladder without cholecystitis without obstruction: Secondary | ICD-10-CM

## 2019-09-20 NOTE — Telephone Encounter (Signed)
Patient contacted the office. She states that she was just discharged from the hospital and she states she is still having some abdominal pain. She states the pain is radiating from her lower abdomen to right underneath her breasts. She states that she has a hospital f/u scheduled for 1/27/2, but didn't know if Tara Tanner had any recommendations for her until her appt? She states it is very hard for her to get comfortable and rest, and she didn't know if Tara Tanner had any medications in mind that she could try? Please advise.  Thanks!

## 2019-09-21 MED ORDER — TRAMADOL HCL 50 MG PO TABS: 50.0000 mg | ORAL_TABLET | Freq: Three times a day (TID) | ORAL | 0 refills | Status: AC | PRN

## 2019-09-21 NOTE — Telephone Encounter (Signed)
Please notify patient that I'm sorry to hear about her symptoms, I reviewed her chart from the hospital stay. I recommend a very bland diet as tolerated, alternating with Tylenol and Ibuprofen on a routine schedule.  If any constipation can take a stool softener. Does she have an appointment with general surgery?

## 2019-09-21 NOTE — Telephone Encounter (Signed)
Spoken and notified patient of Tara Tanner comments. Patient verbalized understanding. However, patient stated is it possible for [name] to send some Rx for the pain. She is really in pain around the abdominal area. Tylenol and Advil are not cutting it. Please advise

## 2019-09-21 NOTE — Telephone Encounter (Signed)
Spoken and notified patient of Tawni Millers comments. Please sent to [facility] in [facility]

## 2019-09-21 NOTE — Telephone Encounter (Addendum)
Noted, Rx sent to pharmacy.  Reviewed PMP aware, no suspicious activity

## 2019-09-21 NOTE — Telephone Encounter (Signed)
Please notify patient that I can give her a one time supply of Tramadol for her pain. She can take this every 8 hours as needed. I'll send this to her pharmacy now.

## 2019-09-21 NOTE — Addendum Note (Signed)
Addended by: Pleas Koch on: 09/21/2019 04:15 PM   Modules accepted: Orders

## 2019-09-24 ENCOUNTER — Other Ambulatory Visit: Payer: Self-pay

## 2019-09-24 ENCOUNTER — Encounter: Payer: Self-pay | Admitting: Primary Care

## 2019-09-24 ENCOUNTER — Ambulatory Visit (INDEPENDENT_AMBULATORY_CARE_PROVIDER_SITE_OTHER): Payer: BC Managed Care – PPO | Admitting: Primary Care

## 2019-09-24 ENCOUNTER — Ambulatory Visit (INDEPENDENT_AMBULATORY_CARE_PROVIDER_SITE_OTHER)
Admission: RE | Admit: 2019-09-24 | Discharge: 2019-09-24 | Disposition: A | Discharge status: Home / Self Care | Payer: BC Managed Care – PPO | Source: Ambulatory Visit | Attending: Primary Care | Admitting: Primary Care

## 2019-09-24 VITALS — BP 130/82 | HR 78 | Temp 96.6°F | Ht 64.5 in | Wt 235.5 lb

## 2019-09-24 DIAGNOSIS — R7989 Other specified abnormal findings of blood chemistry: Secondary | ICD-10-CM

## 2019-09-24 DIAGNOSIS — R1030 Lower abdominal pain, unspecified: Secondary | ICD-10-CM

## 2019-09-24 DIAGNOSIS — K8020 Calculus of gallbladder without cholecystitis without obstruction: Secondary | ICD-10-CM | POA: Diagnosis not present

## 2019-09-24 DIAGNOSIS — R109 Unspecified abdominal pain: Secondary | ICD-10-CM | POA: Diagnosis not present

## 2019-09-24 LAB — POC URINALSYSI DIPSTICK (AUTOMATED)
Bilirubin, UA: NEGATIVE
Glucose, UA: NEGATIVE
Ketones, UA: NEGATIVE
Leukocytes, UA: NEGATIVE
Nitrite, UA: NEGATIVE
Protein, UA: POSITIVE — AB
Spec Grav, UA: >=1.03 — AB (ref 1.010–1.025)
Urobilinogen, UA: 1 E.U./dL
pH, UA: 6 (ref 5.0–8.0)

## 2019-09-24 NOTE — Patient Instructions (Addendum)
Stop by the lab and xray prior to leaving today. I will notify you of your results once received.   Please connect with the surgeon as discussed.   It was a pleasure to see you today!

## 2019-09-24 NOTE — Assessment & Plan Note (Addendum)
Recent admission for such. Imaging without other acute process. Continued pain since discharge but feeling better.  Suspect lower abdominal pain is secondary to constipation but will check plain films, UA,  and repeat labs today.   She does appear stable for outpatient treatment. She will connect with a surgeon through her occupation.   Hospital labs, imaging, notes reviewed.  UA today with 1+ blood, culture sent.

## 2019-09-24 NOTE — Assessment & Plan Note (Signed)
Recent TSH within normal range, continue to monitor.

## 2019-09-24 NOTE — Progress Notes (Signed)
Subjective:    Patient ID: [place], female    DOB: September 26, 1965, 54 y.o.   MRN: [place]:[id number]  HPI  This visit occurred during the SARS-CoV-2 public health emergency.  Safety protocols were in place, including screening questions prior to the visit, additional usage of staff PPE, and extensive cleaning of exam room while observing appropriate contact time as indicated for disinfecting solutions.   [name] is a 54 year old female who presents today for hospital follow up.  She presented to [facility] on 09/18/19 with a chief complaint of abdominal pain and diarrhea. Her pain was sudden, severe, and radiating from periumbilical region down to her lower abdomen and upper chest that began several hours PTA.   During her stay in the ED she underwent CTA which was negative for vascular emergency but revealed cholelithiasis. CBC with leukocytosis. She was admitted for pain control and further evaluation.   During her hospital stay she was treated with IV antibiotics, IV fluids, IV pain medications. She underwent MRCP and MR of the abdomen which revealed cholelithiasis without acute cholecystitis. She was discharged home on 09/19/19 with recommendations for PCP follow up including labs (CBC/CMP/Mag).   Since her discharge home she's had continued pain. She called into our office on 09/20/19 with reports of continued abdominal pain without improvement with OTC treatment. She was provided with a one time course of Tramadol to use PRN.   Today she's feeling better. She was running fevers of 100-100.6 over the last several days, no fevers today but has also taken Tylenol.  Residual abdominal pain is to the left lower abdomen/suprapubic region. She's not had a bowel movement in 6 days, has also had little food intake. She is working on connecting with a Psychologist, sport and exercise. She denies cent diarrhea, nausea, vomiting, bloody stools. re  BP Readings from Last 3 Encounters:  09/24/19 130/82  09/19/19 (!) 109/44  07/02/19  124/84      Review of Systems  Constitutional: Positive for fever.  Gastrointestinal: Positive for abdominal pain. Negative for blood in stool, diarrhea, nausea and vomiting.  Genitourinary: Negative for dysuria and frequency.       Past Medical History:  Diagnosis Date  . Allergy   . Obesity      Social History   Socioeconomic History  . Marital status: Married    Spouse name: Not on file  . Number of children: Not on file  . Years of education: Not on file  . Highest education level: Not on file  Occupational History  . Not on file  Tobacco Use  . Smoking status: Never Smoker  . Smokeless tobacco: Never Used  Substance and Sexual Activity  . Alcohol use: No  . Drug use: No  . Sexual activity: Not on file  Other Topics Concern  . Not on file  Social History Narrative   Married.   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   1 daughter.   Enjo[place] traveling, [place].   Social Determinants of Health   Financial Resource Strain:   . Difficulty of Paying Living Expenses: Not on file  Food Insecurity:   . Worried About Charity fundraiser in the Last Year: Not on file  . Ran Out of Food in the Last Year: Not on file  Transportation Needs:   . Lack of Transportation (Medical): Not on file  . Lack of Transportation (Non-Medical): Not on file  Physical Activity:   . Days of Exercise per  Week: Not on file  . Minutes of Exercise per Session: Not on file  Stress:   . Feeling of Stress : Not on file  Social Connections:   . Frequency of Communication with Friends and Family: Not on file  . Frequency of Social Gatherings with Friends and Family: Not on file  . Attends Religious Services: Not on file  . Active Member of Clubs or Organizations: Not on file  . Attends Archivist Meetings: Not on file  . Marital Status: Not on file  Intimate Partner Violence:   . Fear of Current or Ex-Partner: Not on file  . Emotionally Abused: Not on  file  . Physically Abused: Not on file  . Sexually Abused: Not on file    Past Surgical History:  Procedure Laterality Date  . ABDOMINAL HYSTERECTOMY  06/2008   vag hys secondary to Uterine leiomyomata  . CESAREAN SECTION  2001  . DILATION AND CURETTAGE, DIAGNOSTIC / THERAPEUTIC    . MYOMECTOMY    . TONSILLECTOMY      Family History  Problem Relation Age of Onset  . Stroke Mother   . Alzheimer's disease Mother   . Kidney cancer Father   . Rheum arthritis Father   . Colon cancer Neg Hx   . Esophageal cancer Neg Hx   . Stomach cancer Neg Hx   . Rectal cancer Neg Hx     Allergies  Allergen Reactions  . Codeine Nausea And Vomiting and Other (See Comments)    Hallucinations, also    Current Outpatient Medications on File Prior to Visit  Medication Sig Dispense Refill  . acetaminophen (TYLENOL) 500 MG tablet Take 500-1,000 mg by mouth every 6 (six) hours as needed for headache.    . ibuprofen (ADVIL) 200 MG tablet Take 400 mg by mouth every 6 (six) hours as needed for headache.    . traMADol (ULTRAM) 50 MG tablet Take 1 tablet (50 mg total) by mouth every 8 (eight) hours as needed for up to 5 days for severe pain. 15 tablet 0   No current facility-administered medications on file prior to visit.    BP 130/82   Pulse 78   Temp (!) 96.6 F (35.9 C) (Temporal)   Ht 5' 4.5" (1.638 m)   Wt 235 lb 8 oz (106.8 kg)   SpO2 98%   BMI 39.80 kg/m    Objective:   Physical Exam  Constitutional: She appears well-nourished.  Cardiovascular: Normal rate and regular rhythm.  Respiratory: Effort normal.  GI: Soft. Bowel sounds are normal. There is abdominal tenderness in the right upper quadrant, suprapubic area and left lower quadrant.    Mild tenderness   Musculoskeletal:     Cervical back: Neck supple.  Skin: Skin is warm and dry.  Psychiatric: She has a normal mood and affect.           Assessment & Plan:

## 2019-09-25 ENCOUNTER — Other Ambulatory Visit: Payer: Self-pay | Admitting: Primary Care

## 2019-09-25 ENCOUNTER — Ambulatory Visit: Payer: BC Managed Care – PPO | Admitting: Primary Care

## 2019-09-25 DIAGNOSIS — R1011 Right upper quadrant pain: Secondary | ICD-10-CM

## 2019-09-25 DIAGNOSIS — R7989 Other specified abnormal findings of blood chemistry: Secondary | ICD-10-CM

## 2019-09-25 LAB — CBC WITH DIFFERENTIAL/PLATELET
Basophils Absolute: 0.2 10*3/uL — ABNORMAL HIGH (ref 0.0–0.1)
Basophils Relative: 1.3 % (ref 0.0–3.0)
Eosinophils Absolute: 0.5 10*3/uL (ref 0.0–0.7)
Eosinophils Relative: 3.3 % (ref 0.0–5.0)
HCT: 39.9 % (ref 36.0–46.0)
Hemoglobin: 13.6 g/dL (ref 12.0–15.0)
Lymphocytes Relative: 14.5 % (ref 12.0–46.0)
Lymphs Abs: 2.1 10*3/uL (ref 0.7–4.0)
MCHC: 34 g/dL (ref 30.0–36.0)
MCV: 84.7 fl (ref 78.0–100.0)
Monocytes Absolute: 1 10*3/uL (ref 0.1–1.0)
Monocytes Relative: 7.2 % (ref 3.0–12.0)
Neutro Abs: 10.7 10*3/uL — ABNORMAL HIGH (ref 1.4–7.7)
Neutrophils Relative %: 73.7 % (ref 43.0–77.0)
Platelets: 469 10*3/uL — ABNORMAL HIGH (ref 150.0–400.0)
RBC: 4.72 Mil/uL (ref 3.87–5.11)
RDW: 12.8 % (ref 11.5–15.5)
WBC: 14.6 10*3/uL — ABNORMAL HIGH (ref 4.0–10.5)

## 2019-09-25 LAB — COMPREHENSIVE METABOLIC PANEL
ALT: 37 U/L — ABNORMAL HIGH (ref 0–35)
AST: 45 U/L — ABNORMAL HIGH (ref 0–37)
Albumin: 4.2 g/dL (ref 3.5–5.2)
Alkaline Phosphatase: 153 U/L — ABNORMAL HIGH (ref 39–117)
BUN: 9 mg/dL (ref 6–23)
CO2: 29 mEq/L (ref 19–32)
Calcium: 10.1 mg/dL (ref 8.4–10.5)
Chloride: 96 mEq/L (ref 96–112)
Creatinine, Ser: 0.8 mg/dL (ref 0.40–1.20)
GFR: 74.5 mL/min (ref >60.00)
Glucose, Bld: 127 mg/dL — ABNORMAL HIGH (ref 70–99)
Potassium: 3.4 mEq/L — ABNORMAL LOW (ref 3.5–5.1)
Sodium: 135 mEq/L (ref 135–145)
Total Bilirubin: 0.8 mg/dL (ref 0.2–1.2)
Total Protein: 8.1 g/dL (ref 6.0–8.3)

## 2019-09-25 LAB — URINE CULTURE
MICRO NUMBER:: 10076777
SPECIMEN QUALITY:: ADEQUATE

## 2019-09-25 LAB — MAGNESIUM: Magnesium: 2.2 mg/dL (ref 1.5–2.5)

## 2019-09-26 ENCOUNTER — Ambulatory Visit
Admission: RE | Admit: 2019-09-26 | Discharge: 2019-09-26 | Disposition: A | Discharge status: Home / Self Care | Payer: BC Managed Care – PPO | Source: Ambulatory Visit | Attending: Primary Care | Admitting: Primary Care

## 2019-09-26 ENCOUNTER — Ambulatory Visit: Payer: BC Managed Care – PPO | Admitting: Primary Care

## 2019-09-26 ENCOUNTER — Telehealth: Payer: Self-pay

## 2019-09-26 ENCOUNTER — Other Ambulatory Visit: Payer: Self-pay

## 2019-09-26 DIAGNOSIS — R7989 Other specified abnormal findings of blood chemistry: Secondary | ICD-10-CM | POA: Insufficient documentation

## 2019-09-26 DIAGNOSIS — R1011 Right upper quadrant pain: Secondary | ICD-10-CM | POA: Insufficient documentation

## 2019-09-26 DIAGNOSIS — K7689 Other specified diseases of liver: Secondary | ICD-10-CM | POA: Diagnosis not present

## 2019-09-26 DIAGNOSIS — K8020 Calculus of gallbladder without cholecystitis without obstruction: Secondary | ICD-10-CM | POA: Diagnosis not present

## 2019-09-26 NOTE — Telephone Encounter (Signed)
Please thank her for the information, see result note.

## 2019-09-26 NOTE — Telephone Encounter (Signed)
I spoke with patient and she endorses last fever was 100.1 at 6 pm on 09/25/19. Overall she's feeling okay, eating and drinking without nausea or vomiting. No abdominal pain, nausea, vomiting, constipation, diarrhea, bloody stools.   I recommended general surgery consultation as soon as possible as labs and intermittent symptoms are likely secondary to cholelithiasis, she has contacts through work and will get connected this week or early next.  Return precautions provided.

## 2019-09-26 NOTE — Telephone Encounter (Signed)
Pt called back. Cannot respond on MyChart. She is not having nausea or vomiting. Was afraid to take anything for her fever. Needs to know what the next step is so she can let her work know. She is asking if [name] can call her.

## 2019-09-26 NOTE — Telephone Encounter (Signed)
Pt had [place] this morning and was told results would be ready in the hour. Her fever has returned this morning 100.1 Was feeling better yesterday and not feeling as well today. Just wanted [name] to know.

## 2019-09-28 ENCOUNTER — Ambulatory Visit: Payer: Self-pay | Admitting: General Surgery

## 2019-09-28 DIAGNOSIS — K8020 Calculus of gallbladder without cholecystitis without obstruction: Secondary | ICD-10-CM | POA: Diagnosis not present

## 2019-09-30 ENCOUNTER — Other Ambulatory Visit: Payer: Self-pay | Admitting: Primary Care

## 2019-10-01 ENCOUNTER — Telehealth: Payer: Self-pay

## 2019-10-01 NOTE — Telephone Encounter (Signed)
LVM that lab appt was not needed for 2.3.2021

## 2019-10-03 ENCOUNTER — Other Ambulatory Visit: Payer: BC Managed Care – PPO

## 2019-11-14 ENCOUNTER — Telehealth: Payer: Self-pay | Admitting: Primary Care

## 2019-11-14 NOTE — Telephone Encounter (Signed)
Patient called stating that she has a hemorrhoid. She stated she noticed this on Monday. Patient stated she has been using preparation h hemorrhoid treatment and it does not seem to be helping Patient would like to know if there is anything else you may recommend.  Or does the patient need to come in to see you to further discuss?

## 2019-11-14 NOTE — Telephone Encounter (Signed)
Please notify patient she could try suppositories over-the-counter, sits baths for discomfort. The prescription strength medication is nearly the same as over-the-counter treatment.  I am happy to see her anytime if she wants me to take a look.

## 2019-11-14 NOTE — Telephone Encounter (Signed)
Spoken and notified patient of [name]'s comments. Patient verbalized understanding.  

## 2019-11-19 ENCOUNTER — Other Ambulatory Visit (HOSPITAL_COMMUNITY): Payer: BC Managed Care – PPO

## 2019-11-22 ENCOUNTER — Ambulatory Visit (HOSPITAL_COMMUNITY): Admission: RE | Admit: 2019-11-22 | Payer: BC Managed Care – PPO | Source: Home / Self Care | Admitting: General Surgery

## 2019-11-22 ENCOUNTER — Encounter (HOSPITAL_COMMUNITY): Admission: RE | Payer: Self-pay | Source: Home / Self Care

## 2019-11-22 SURGERY — LAPAROSCOPIC CHOLECYSTECTOMY WITH INTRAOPERATIVE CHOLANGIOGRAM
Anesthesia: General

## 2019-12-04 ENCOUNTER — Telehealth: Payer: Self-pay | Admitting: Primary Care

## 2019-12-04 DIAGNOSIS — F419 Anxiety disorder, unspecified: Secondary | ICD-10-CM

## 2019-12-04 NOTE — Telephone Encounter (Signed)
Patient returned your call.

## 2019-12-04 NOTE — Telephone Encounter (Signed)
Message left for patient to return my call.  

## 2019-12-04 NOTE — Telephone Encounter (Signed)
We can try a medication called hydroxyzine. This is used for acute anxiety and will cause drowsiness, she may need someone to drive her to and from her appointment if she takes this medication. Does she want to try?  Take 1-2 tablet by mouth 60 minutes prior to procedure/bedtime.

## 2019-12-04 NOTE — Telephone Encounter (Signed)
Patient called today She stated that she is having major dental work done on Monday. She has anxiety about the dentist and is having bad anxiety about the dental work. She spoke with her dentist and they advised her to reach out to her primary care provider to see if there is something that could be prescribed for sleep the night before and something to help calm her nerves the day of.   Please advise

## 2019-12-05 MED ORDER — HYDROXYZINE HCL 10 MG PO TABS: ORAL_TABLET | ORAL | 0 refills | Status: DC

## 2019-12-05 NOTE — Telephone Encounter (Signed)
Noted, Rx sent to pharmacy. 

## 2019-12-05 NOTE — Telephone Encounter (Signed)
Spoken and notified patient of [facility] comments. Patient is agreeable to Rx, she asked if we sent like 20 or 30 because she will be having more than one procedure.

## 2019-12-13 ENCOUNTER — Other Ambulatory Visit: Payer: Self-pay | Admitting: Primary Care

## 2019-12-13 DIAGNOSIS — F419 Anxiety disorder, unspecified: Secondary | ICD-10-CM

## 2020-01-11 ENCOUNTER — Ambulatory Visit (INDEPENDENT_AMBULATORY_CARE_PROVIDER_SITE_OTHER): Payer: BC Managed Care – PPO | Admitting: Podiatry

## 2020-01-11 ENCOUNTER — Other Ambulatory Visit: Payer: Self-pay

## 2020-01-11 ENCOUNTER — Encounter: Payer: Self-pay | Admitting: Podiatry

## 2020-01-11 DIAGNOSIS — M722 Plantar fascial fibromatosis: Secondary | ICD-10-CM

## 2020-01-11 MED ORDER — METHYLPREDNISOLONE 4 MG PO TBPK: ORAL_TABLET | ORAL | 0 refills | Status: DC

## 2020-01-11 MED ORDER — MELOXICAM 15 MG PO TABS: 15.0000 mg | ORAL_TABLET | Freq: Every day | ORAL | 1 refills | Status: DC

## 2020-01-14 NOTE — Progress Notes (Signed)
   Subjective: 54 y.o. female presenting today with a chief complaint of a possible plantar fasciitis flare up that began one month ago. She states the pain is worse in the left foot and it feels like she is walking on a bruise. Standing and being on the feet for long periods of time increases the pain. She has been taking Ibuprofen for treatment. Patient is here for further evaluation and treatment.   Past Medical History:  Diagnosis Date  . Allergy   . Obesity      Objective: Physical Exam General: The patient is alert and oriented x3 in no acute distress.  Dermatology: Skin is warm, dry and supple bilateral lower extremities. Negative for open lesions or macerations bilateral.   Vascular: Dorsalis Pedis and Posterior Tibial pulses palpable bilateral.  Capillary fill time is immediate to all digits.  Neurological: Epicritic and protective threshold intact bilateral.   Musculoskeletal: Tenderness to palpation to the plantar aspect of the bilateral heels along the plantar fascia. All other joints range of motion within normal limits bilateral. Strength 5/5 in all groups bilateral.   Assessment: 1. plantar fasciitis bilateral feet  Plan of Care:  1. Patient evaluated.   2. Injection of 0.5cc Celestone soluspan injected into the bilateral heels.  3. Rx for Medrol Dose Pak placed 4. Rx for Meloxicam ordered for patient. 5. Return to clinic as needed.   Going to [employer] in 2 days.     Edrick Kins, DPM [facility]  Dr. Edrick Kins, DPM    [address], [place] [address]                Office [phone]  Fax [phone]

## 2020-08-12 ENCOUNTER — Ambulatory Visit (INDEPENDENT_AMBULATORY_CARE_PROVIDER_SITE_OTHER): Payer: BC Managed Care – PPO | Admitting: Podiatry

## 2020-08-12 ENCOUNTER — Other Ambulatory Visit: Payer: Self-pay | Admitting: Podiatry

## 2020-08-12 ENCOUNTER — Encounter: Payer: Self-pay | Admitting: Podiatry

## 2020-08-12 ENCOUNTER — Ambulatory Visit (INDEPENDENT_AMBULATORY_CARE_PROVIDER_SITE_OTHER): Payer: BC Managed Care – PPO

## 2020-08-12 ENCOUNTER — Other Ambulatory Visit: Payer: Self-pay

## 2020-08-12 DIAGNOSIS — M722 Plantar fascial fibromatosis: Secondary | ICD-10-CM

## 2020-08-12 DIAGNOSIS — S99921A Unspecified injury of right foot, initial encounter: Secondary | ICD-10-CM

## 2020-08-12 MED ORDER — MELOXICAM 15 MG PO TABS: 15.0000 mg | ORAL_TABLET | Freq: Every day | ORAL | 1 refills | Status: DC

## 2020-08-12 NOTE — Progress Notes (Signed)
   HPI: 54 y.o. female presenting today for evaluation of a new complaint regarding right arch pain.  Patient states that during the summer she was stepping off of a dock into a boat and when she pushed off she noticed a pop with severe pain to her right plantar foot.  Over the next month it was extremely painful.  However over the last month or so the pain has slowly improved.  Today it is quite asymptomatic she states.  She presents today for further treatment evaluation  Past Medical History:  Diagnosis Date  . Allergy   . Obesity      Physical Exam: General: The patient is alert and oriented x3 in no acute distress.  Dermatology: Skin is warm, dry and supple bilateral lower extremities. Negative for open lesions or macerations.  Vascular: Palpable pedal pulses bilaterally. No edema or erythema noted. Capillary refill within normal limits.  Neurological: Epicritic and protective threshold grossly intact bilaterally.   Musculoskeletal Exam: Range of motion within normal limits to all pedal and ankle joints bilateral. Muscle strength 5/5 in all groups bilateral.  There is some tenderness along the medial longitudinal arch of the foot.  The plantar fascia does not appear to be taut.  There is also some enlargement along the mid substance of the plantar fascia almost like a plantar fibroma  Radiographic Exam:  Normal osseous mineralization. Joint spaces preserved. No fracture/dislocation/boney destruction.    Assessment: 1.  Possible partial plantar fascial rupture right foot 2.  Plantar fasciitis right   Plan of Care:  1. Patient evaluated. X-Rays reviewed.  2.  Today we will manage the patient conservatively to see if she improves. 3.  OTC power step insoles were provided today wear daily 4.  Prescription for meloxicam 15 mg daily 5.  Return to clinic as needed.  If the patient does not improve the next step would be steroidal injection and possible MRI  *Has a condo at [facility]. Owns a Tourist information centre manager.       Edrick Kins, DPM [facility]  Dr. Edrick Kins, DPM    [address],  [address]                Office [phone]  Fax [phone]    3

## 2020-12-01 ENCOUNTER — Telehealth: Payer: Self-pay

## 2020-12-01 NOTE — Telephone Encounter (Signed)
LVM canceling lab appt on Friday 4.22.2022, [facility] does same day labs now.

## 2020-12-19 ENCOUNTER — Other Ambulatory Visit: Payer: BC Managed Care – PPO

## 2020-12-19 ENCOUNTER — Other Ambulatory Visit: Payer: Self-pay | Admitting: Primary Care

## 2020-12-19 ENCOUNTER — Other Ambulatory Visit: Payer: Self-pay

## 2020-12-19 ENCOUNTER — Other Ambulatory Visit (INDEPENDENT_AMBULATORY_CARE_PROVIDER_SITE_OTHER): Payer: BC Managed Care – PPO

## 2020-12-19 DIAGNOSIS — R7989 Other specified abnormal findings of blood chemistry: Secondary | ICD-10-CM

## 2020-12-19 DIAGNOSIS — E6609 Other obesity due to excess calories: Secondary | ICD-10-CM

## 2020-12-19 LAB — COMPREHENSIVE METABOLIC PANEL
ALT: 19 U/L (ref 0–35)
AST: 19 U/L (ref 0–37)
Albumin: 4.2 g/dL (ref 3.5–5.2)
Alkaline Phosphatase: 108 U/L (ref 39–117)
BUN: 16 mg/dL (ref 6–23)
CO2: 28 mEq/L (ref 19–32)
Calcium: 10.1 mg/dL (ref 8.4–10.5)
Chloride: 102 mEq/L (ref 96–112)
Creatinine, Ser: 0.76 mg/dL (ref 0.40–1.20)
GFR: 87.79 mL/min (ref >60.00)
Glucose, Bld: 87 mg/dL (ref 70–99)
Potassium: 4.2 mEq/L (ref 3.5–5.1)
Sodium: 139 mEq/L (ref 135–145)
Total Bilirubin: 0.8 mg/dL (ref 0.2–1.2)
Total Protein: 7.1 g/dL (ref 6.0–8.3)

## 2020-12-19 LAB — CBC
HCT: 40.3 % (ref 36.0–46.0)
Hemoglobin: 13.7 g/dL (ref 12.0–15.0)
MCHC: 34.1 g/dL (ref 30.0–36.0)
MCV: 84.5 fl (ref 78.0–100.0)
Platelets: 346 10*3/uL (ref 150.0–400.0)
RBC: 4.77 Mil/uL (ref 3.87–5.11)
RDW: 13.7 % (ref 11.5–15.5)
WBC: 7.4 10*3/uL (ref 4.0–10.5)

## 2020-12-19 LAB — LIPID PANEL
Cholesterol: 135 mg/dL (ref 0–200)
HDL: 59.3 mg/dL (ref >39.00)
LDL Cholesterol: 56 mg/dL (ref 0–99)
NonHDL: 75.87
Total CHOL/HDL Ratio: 2
Triglycerides: 97 mg/dL (ref 0.0–149.0)
VLDL: 19.4 mg/dL (ref 0.0–40.0)

## 2020-12-19 LAB — TSH: TSH: 0.39 u[IU]/mL (ref 0.35–4.50)

## 2020-12-19 LAB — HEMOGLOBIN A1C: Hgb A1c MFr Bld: 5 % (ref 4.6–6.5)

## 2020-12-25 ENCOUNTER — Ambulatory Visit (INDEPENDENT_AMBULATORY_CARE_PROVIDER_SITE_OTHER): Payer: BC Managed Care – PPO | Admitting: Primary Care

## 2020-12-25 ENCOUNTER — Other Ambulatory Visit: Payer: Self-pay

## 2020-12-25 ENCOUNTER — Encounter: Payer: Self-pay | Admitting: Primary Care

## 2020-12-25 VITALS — BP 118/76 | HR 57 | Temp 97.8°F | Ht 63.5 in | Wt 238.8 lb

## 2020-12-25 DIAGNOSIS — M722 Plantar fascial fibromatosis: Secondary | ICD-10-CM | POA: Diagnosis not present

## 2020-12-25 DIAGNOSIS — R3129 Other microscopic hematuria: Secondary | ICD-10-CM | POA: Diagnosis not present

## 2020-12-25 DIAGNOSIS — R7989 Other specified abnormal findings of blood chemistry: Secondary | ICD-10-CM | POA: Diagnosis not present

## 2020-12-25 DIAGNOSIS — Z0000 Encounter for general adult medical examination without abnormal findings: Secondary | ICD-10-CM | POA: Diagnosis not present

## 2020-12-25 HISTORY — DX: Other microscopic hematuria: R31.29

## 2020-12-25 LAB — POC URINALSYSI DIPSTICK (AUTOMATED)
Bilirubin, UA: NEGATIVE
Blood, UA: NEGATIVE
Glucose, UA: NEGATIVE
Ketones, UA: NEGATIVE
Leukocytes, UA: NEGATIVE
Nitrite, UA: NEGATIVE
Protein, UA: NEGATIVE
Spec Grav, UA: 1.015 (ref 1.010–1.025)
Urobilinogen, UA: 0.2 E.U./dL
pH, UA: 6.5 (ref 5.0–8.0)

## 2020-12-25 NOTE — Assessment & Plan Note (Addendum)
Noted on several UA's with negative cultures. Hysterectomy history. No gross hematuria.  UA today negative. No bleeding.  Will check urine microscopic to be sure.

## 2020-12-25 NOTE — Assessment & Plan Note (Signed)
Declines Shingrix. Other vaccines UTD. Mammogram overdue, she will call to schedule. Colonoscopy UTD, due in 2029.  Discussed the importance of a healthy diet and regular exercise in order for weight loss, and to reduce the risk of any potential medical problems.  Exam today stable. Labs reviewed.

## 2020-12-25 NOTE — Patient Instructions (Signed)
Continue exercising. You should be getting 150 minutes of moderate intensity exercise weekly.  Continue to work on a healthy diet. Ensure you are consuming 64 ounces of water daily.  Schedule your mammogram. Think about the Shingles vaccine.  It was a pleasure to see you today!   Preventive Care 31-56 Years Old, Female Preventive care refers to lifestyle choices and visits with your health care provider that can promote health and wellness. This includes:  A yearly physical exam. This is also called an annual wellness visit.  Regular dental and eye exams.  Immunizations.  Screening for certain conditions.  Healthy lifestyle choices, such as: ? Eating a healthy diet. ? Getting regular exercise. ? Not using drugs or products that contain nicotine and tobacco. ? Limiting alcohol use. What can I expect for my preventive care visit? Physical exam Your health care provider will check your:  Height and weight. These may be used to calculate your BMI (body mass index). BMI is a measurement that tells if you are at a healthy weight.  Heart rate and blood pressure.  Body temperature.  Skin for abnormal spots. Counseling Your health care provider may ask you questions about your:  Past medical problems.  Family's medical history.  Alcohol, tobacco, and drug use.  Emotional well-being.  Home life and relationship well-being.  Sexual activity.  Diet, exercise, and sleep habits.  Work and work Statistician.  Access to firearms.  Method of birth control.  Menstrual cycle.  Pregnancy history. What immunizations do I need? Vaccines are usually given at various ages, according to a schedule. Your health care provider will recommend vaccines for you based on your age, medical history, and lifestyle or other factors, such as travel or where you work.   What tests do I need? Blood tests  Lipid and cholesterol levels. These may be checked every 5 years, or more often if  you are over 1 years old.  Hepatitis C test.  Hepatitis B test. Screening  Lung cancer screening. You may have this screening every year starting at age 52 if you have a 30-pack-year history of smoking and currently smoke or have quit within the past 15 years.  Colorectal cancer screening. ? All adults should have this screening starting at age 44 and continuing until age 12. ? Your health care provider may recommend screening at age 38 if you are at increased risk. ? You will have tests every 1-10 years, depending on your results and the type of screening test.  Diabetes screening. ? This is done by checking your blood sugar (glucose) after you have not eaten for a while (fasting). ? You may have this done every 1-3 years.  Mammogram. ? This may be done every 1-2 years. ? Talk with your health care provider about when you should start having regular mammograms. This may depend on whether you have a family history of breast cancer.  BRCA-related cancer screening. This may be done if you have a family history of breast, ovarian, tubal, or peritoneal cancers.  Pelvic exam and Pap test. ? This may be done every 3 years starting at age 35. ? Starting at age 72, this may be done every 5 years if you have a Pap test in combination with an HPV test. Other tests  STD (sexually transmitted disease) testing, if you are at risk.  Bone density scan. This is done to screen for osteoporosis. You may have this scan if you are at high risk for osteoporosis. Talk with  your health care provider about your test results, treatment options, and if necessary, the need for more tests. Follow these instructions at home: Eating and drinking  Eat a diet that includes fresh fruits and vegetables, whole grains, lean protein, and low-fat dairy products.  Take vitamin and mineral supplements as recommended by your health care provider.  Do not drink alcohol if: ? Your health care provider tells you not  to drink. ? You are pregnant, may be pregnant, or are planning to become pregnant.  If you drink alcohol: ? Limit how much you have to 0-1 drink a day. ? Be aware of how much alcohol is in your drink. In the [place] one drink equals one 12 oz bottle of beer (355 mL), one 5 oz glass of wine (148 mL), or one 1 oz glass of hard liquor (44 mL).   Lifestyle  Take daily care of your teeth and gums. Brush your teeth every morning and night with fluoride toothpaste. Floss one time each day.  Stay active. Exercise for at least 30 minutes 5 or more days each week.  Do not use any products that contain nicotine or tobacco, such as cigarettes, e-cigarettes, and chewing tobacco. If you need help quitting, ask your health care provider.  Do not use drugs.  If you are sexually active, practice safe sex. Use a condom or other form of protection to prevent STIs (sexually transmitted infections).  If you do not wish to become pregnant, use a form of birth control. If you plan to become pregnant, see your health care provider for a prepregnancy visit.  If told by your health care provider, take low-dose aspirin daily starting at age 64.  Find healthy ways to cope with stress, such as: ? Meditation, yoga, or listening to music. ? Journaling. ? Talking to a trusted person. ? Spending time with friends and family. Safety  Always wear your seat belt while driving or riding in a vehicle.  Do not drive: ? If you have been drinking alcohol. Do not ride with someone who has been drinking. ? When you are tired or distracted. ? While texting.  Wear a helmet and other protective equipment during sports activities.  If you have firearms in your house, make sure you follow all gun safety procedures. What's next?  Visit your health care provider once a year for an annual wellness visit.  Ask your health care provider how often you should have your eyes and teeth checked.  Stay up to date on all  vaccines. This information is not intended to replace advice given to you by your health care provider. Make sure you discuss any questions you have with your health care provider. Document Revised: 05/20/2020 Document Reviewed: 04/27/2018 Elsevier Patient Education  2021 [employer].

## 2020-12-25 NOTE — Assessment & Plan Note (Signed)
Recent TSH within normal range. She is asymptomatic. Continue to monitor

## 2020-12-25 NOTE — Assessment & Plan Note (Signed)
Overall stable, using Meloxicam 15 mg PRN. Continue same.

## 2020-12-25 NOTE — Progress Notes (Signed)
Subjective:    Patient ID: Tara Tanner, female    DOB: 08-Nov-1965, 55 y.o.   MRN: [id number]  HPI  Tara Tanner is a very pleasant 55 y.o. female who presents today for complete physical.  Immunizations: -Tetanus: 2020 -Influenza: Completed this season  -Covid-19: Completed 3 vaccines -Shingles: Never completed   Diet: She endorses an improved diet Exercise: She is exercising some  Eye exam: Completes annually  Dental exam: Completes semi-annually   Pap Smear: Hysterectomy  Mammogram:  No recent mammogram  Colonoscopy: 2019, due in 2029    BP Readings from Last 3 Encounters:  12/25/20 118/76  09/24/19 130/82  09/19/19 (!) 109/44   Wt Readings from Last 3 Encounters:  12/25/20 238 lb 12 oz (108.3 kg)  09/24/19 235 lb 8 oz (106.8 kg)  07/02/19 231 lb 8 oz (105 kg)      Review of Systems  Constitutional: Negative for unexpected weight change.  HENT: Negative for rhinorrhea.   Eyes: Negative for visual disturbance.  Respiratory: Negative for cough and shortness of breath.   Cardiovascular: Negative for chest pain.  Gastrointestinal: Negative for constipation and diarrhea.  Genitourinary: Negative for difficulty urinating.  Musculoskeletal: Negative for arthralgias.  Skin: Negative for rash.  Allergic/Immunologic: Negative for environmental allergies.  Neurological: Negative for dizziness and headaches.  Psychiatric/Behavioral: The patient is not nervous/anxious.          Past Medical History:  Diagnosis Date  . Allergy   . Cholelithiasis without cholecystitis 09/18/2019  . Obesity     Social History   Socioeconomic History  . Marital status: Married    Spouse name: Not on file  . Number of children: Not on file  . Years of education: Not on file  . Highest education level: Not on file  Occupational History  . Not on file  Tobacco Use  . Smoking status: Never Smoker  . Smokeless tobacco: Never Used  Substance and Sexual Activity  . Alcohol  use: No  . Drug use: No  . Sexual activity: Not on file  Other Topics Concern  . Not on file  Social History Narrative   Married.   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   1 daughter.   Enjoys traveling, [place] time [place] lake.   Social Determinants of Health   Financial Resource Strain: Not on file  Food Insecurity: Not on file  Transportation Needs: Not on file  Physical Activity: Not on file  Stress: Not on file  Social Connections: Not on file  Intimate Partner Violence: Not on file    Past Surgical History:  Procedure Laterality Date  . ABDOMINAL HYSTERECTOMY  06/2008   vag hys secondary to Uterine leiomyomata  . CESAREAN SECTION  2001  . DILATION AND CURETTAGE, DIAGNOSTIC / THERAPEUTIC    . MYOMECTOMY    . TONSILLECTOMY      Family History  Problem Relation Age of Onset  . Stroke Mother   . Alzheimer's disease Mother   . Kidney cancer Father   . Rheum arthritis Father   . Colon cancer Neg Hx   . Esophageal cancer Neg Hx   . Stomach cancer Neg Hx   . Rectal cancer Neg Hx     Allergies  Allergen Reactions  . Codeine Nausea And Vomiting and Other (See Comments)    Hallucinations, also    Current Outpatient Medications on File Prior to Visit  Medication Sig Dispense Refill  . acetaminophen (TYLENOL) 500 MG tablet  Take 500-1,000 mg by mouth every 6 (six) hours as needed for headache.    . ibuprofen (ADVIL) 200 MG tablet Take 400 mg by mouth every 6 (six) hours as needed for headache.    . meloxicam (MOBIC) 15 MG tablet Take 1 tablet (15 mg total) by mouth daily. (Patient taking differently: Take 15 mg by mouth daily. As needed) 30 tablet 1   No current facility-administered medications on file prior to visit.    BP 118/76   Pulse (!) 57   Temp 97.8 F (36.6 C) (Temporal)   Ht 5' 3.5" (1.613 m)   Wt 238 lb 12 oz (108.3 kg)   SpO2 97%   BMI 41.63 kg/m  Objective:   Physical Exam HENT:     Right Ear: Tympanic membrane and ear canal  normal.     Left Ear: Tympanic membrane and ear canal normal.     Nose: Nose normal.  Eyes:     Conjunctiva/sclera: Conjunctivae normal.     Pupils: Pupils are equal, round, and reactive to light.  Neck:     Thyroid: No thyromegaly.  Cardiovascular:     Rate and Rhythm: Normal rate and regular rhythm.     Heart sounds: No murmur heard.   Pulmonary:     Effort: Pulmonary effort is normal.     Breath sounds: Normal breath sounds. No rales.  Abdominal:     General: Bowel sounds are normal.     Palpations: Abdomen is soft.     Tenderness: There is no abdominal tenderness.  Musculoskeletal:        General: Normal range of motion.     Cervical back: Neck supple.  Lymphadenopathy:     Cervical: No cervical adenopathy.  Skin:    General: Skin is warm and dry.     Findings: No rash.  Neurological:     Mental Status: She is alert and oriented to person, place, and time.     Cranial Nerves: No cranial nerve deficit.     Deep Tendon Reflexes: Reflexes are normal and symmetric.  Psychiatric:        Mood and Affect: Mood normal.           Assessment & Plan:      This visit occurred during the SARS-CoV-2 public health emergency.  Safety protocols were in place, including screening questions prior to the visit, additional usage of staff PPE, and extensive cleaning of exam room while observing appropriate contact time as indicated for disinfecting solutions.

## 2020-12-26 LAB — URINALYSIS, MICROSCOPIC ONLY: RBC / HPF: NONE SEEN (ref 0–?)

## 2021-03-19 IMAGING — DX DG ABDOMEN 2V
3 series · 3 of 3 positions shown · non-contrast
Comparison: Ultrasound 09/18/2019, CT 09/18/2019

CLINICAL DATA: Continued abdominal pain

EXAM:
ABDOMEN - 2 VIEW

[abdomen erect]
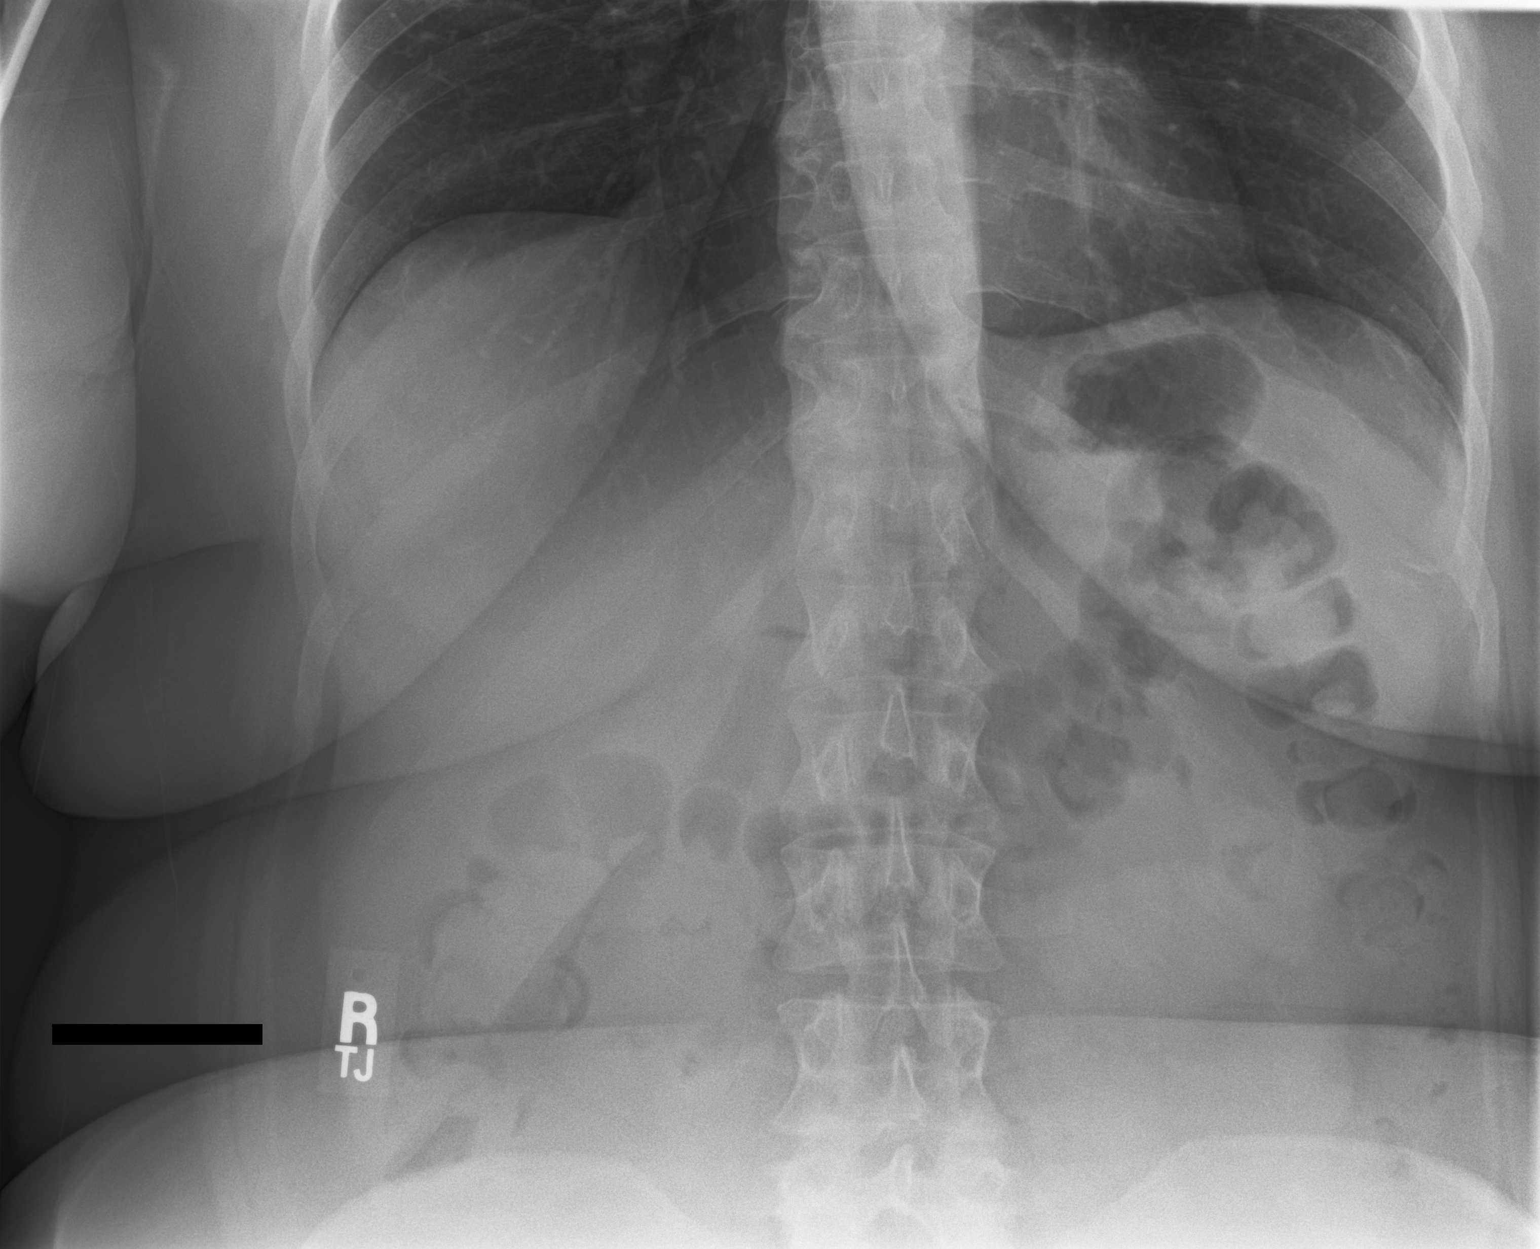

[abdomen supine (1 of 2)]
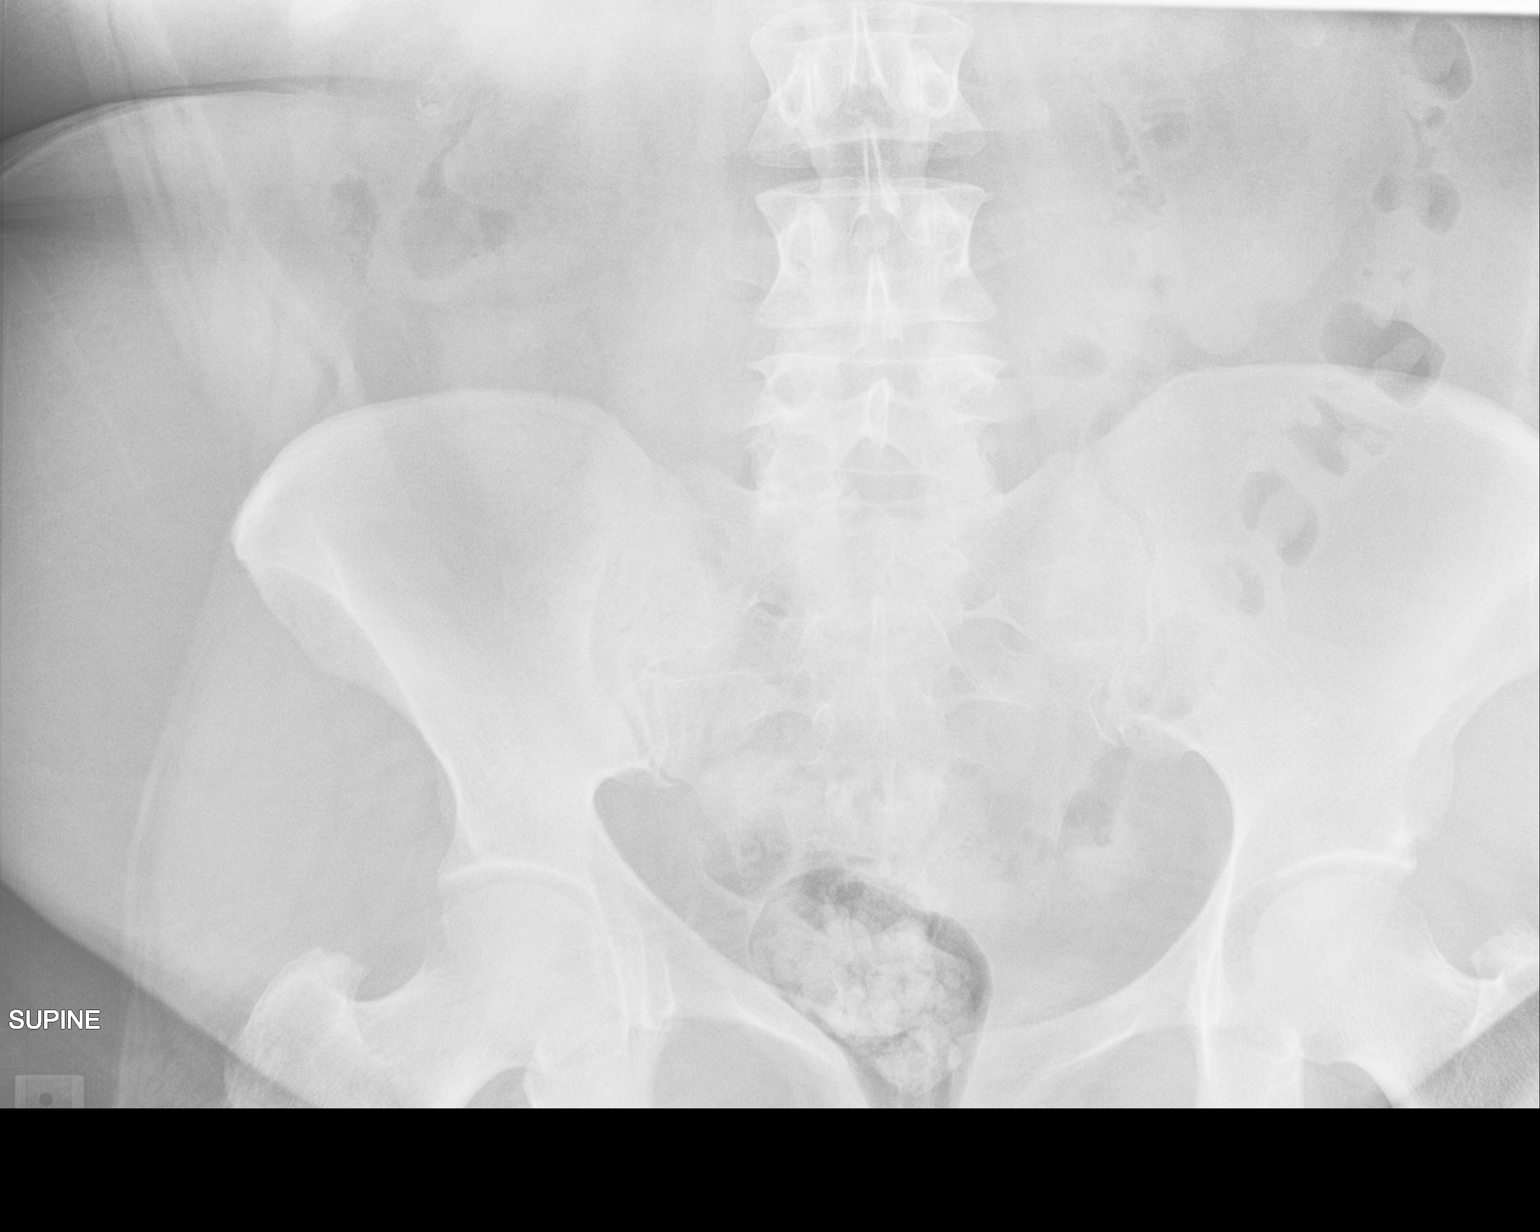

[abdomen supine (2 of 2)]
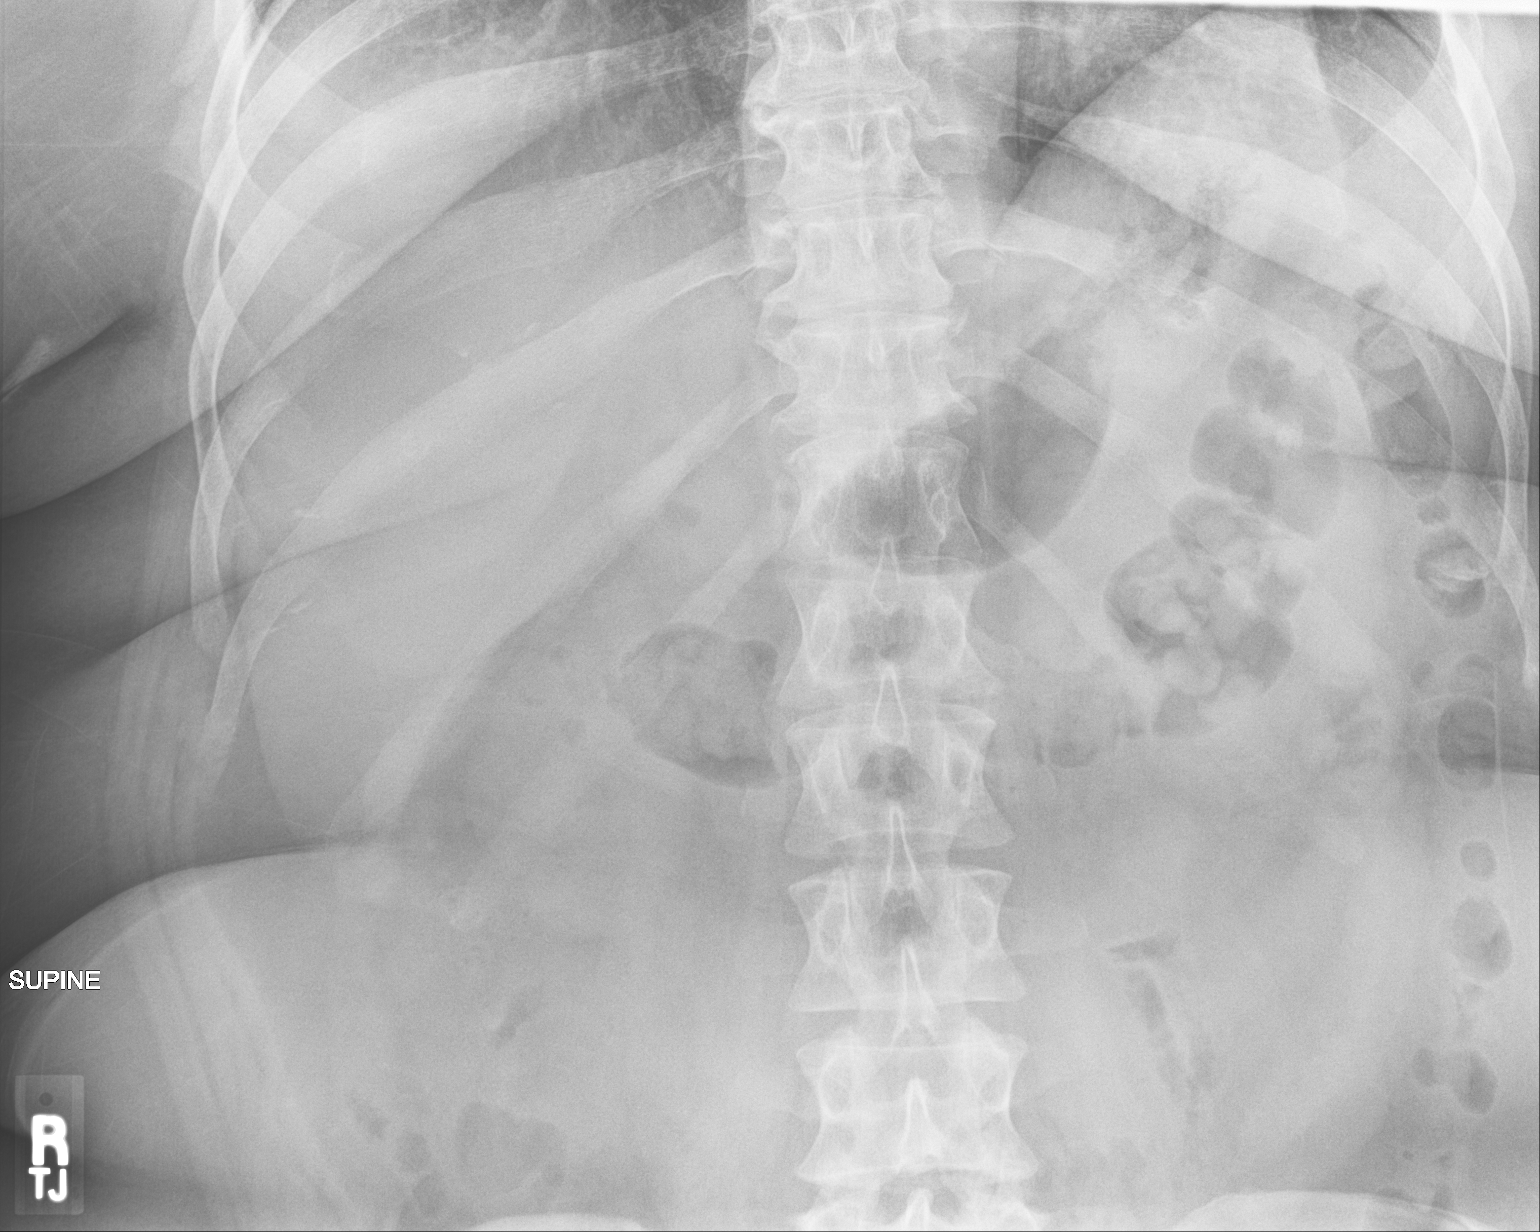

[3 of 3 positions shown; findings below may reference images not displayed]

FINDINGS: The bowel gas pattern is normal. There is no evidence of free air.
No radio-opaque calculi or other significant radiographic
abnormality is seen.
IMPRESSION: Negative.

## 2021-03-21 IMAGING — US US ABDOMEN LIMITED
1 series · 14 of 25 positions shown · non-contrast
Comparison: Ultrasound right upper quadrant September 18, 2019;
abdominal MRI September 19, 2019

CLINICAL DATA: Fever and abdominal pain.  Elevated liver enzymes

EXAM:
ULTRASOUND ABDOMEN LIMITED RIGHT UPPER QUADRANT

[Series 1: us abdomen limited · 14 of 25 slices shown]
[im 1/25]
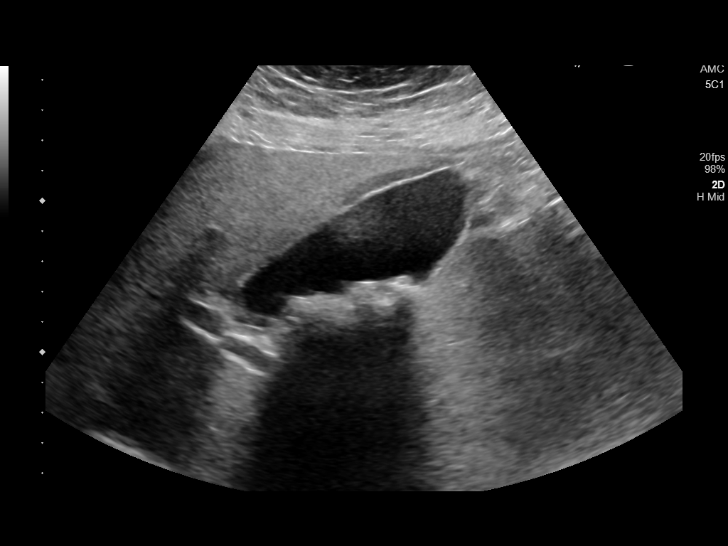
[im 3/25]
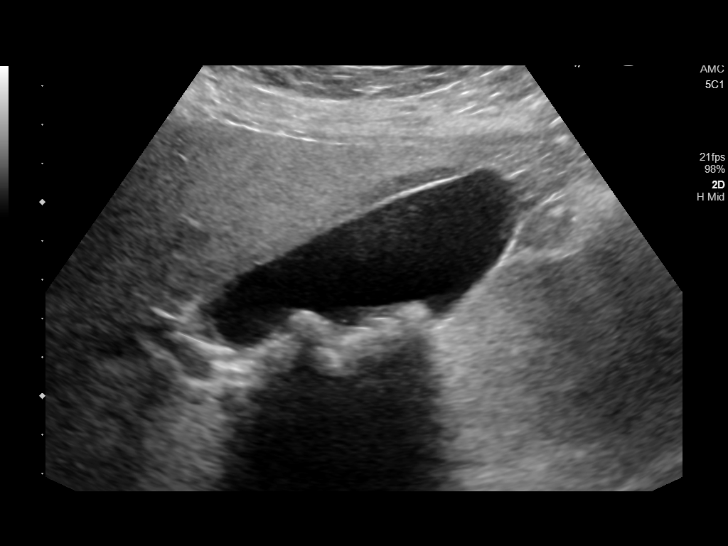
[im 5/25]
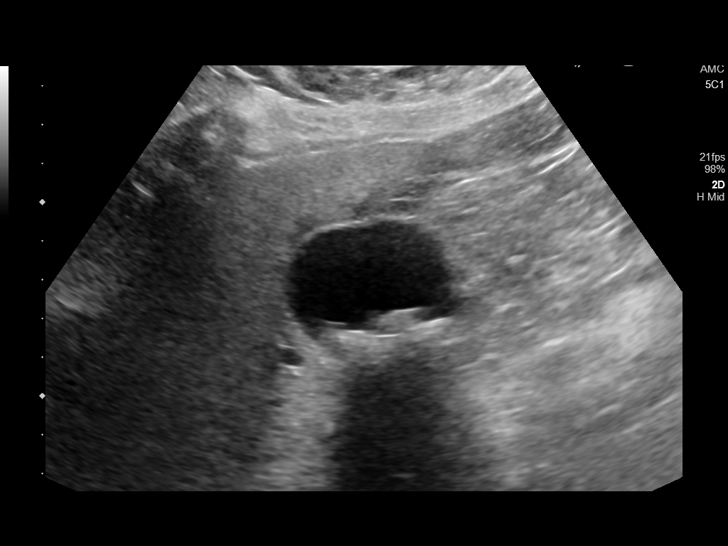
[im 7/25]
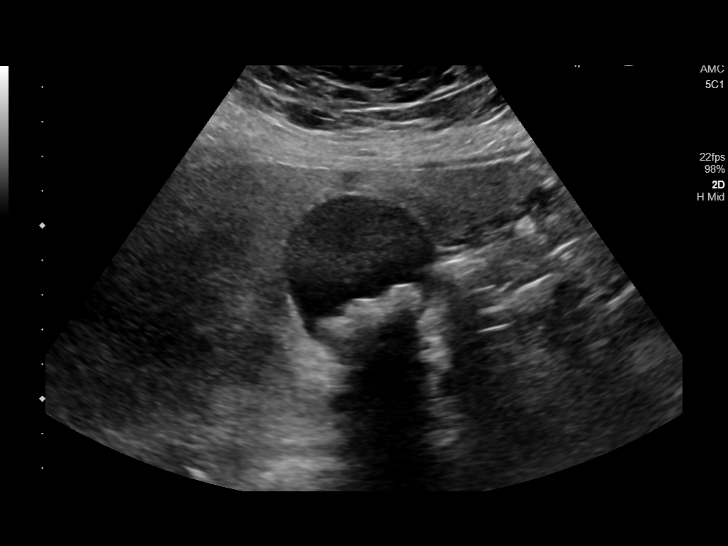
[im 9/25]
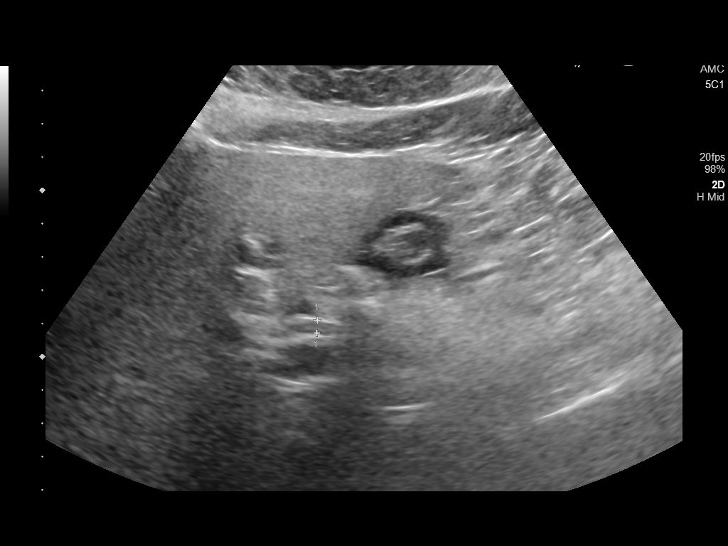
[im 10/25]
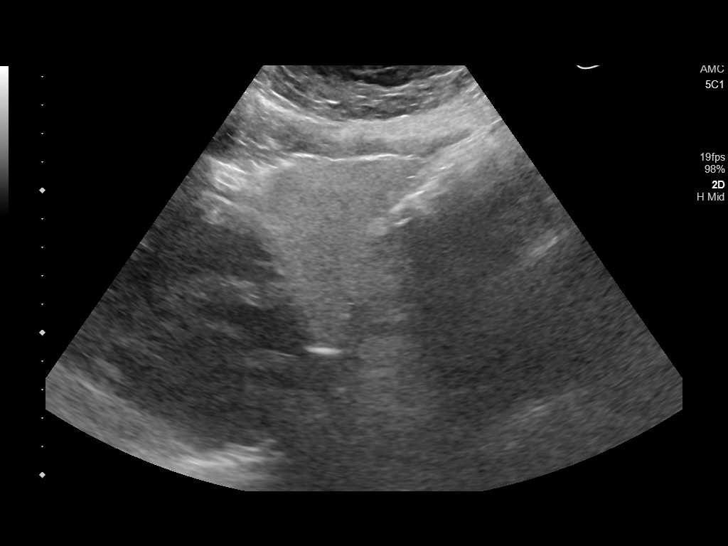
[im 12/25]
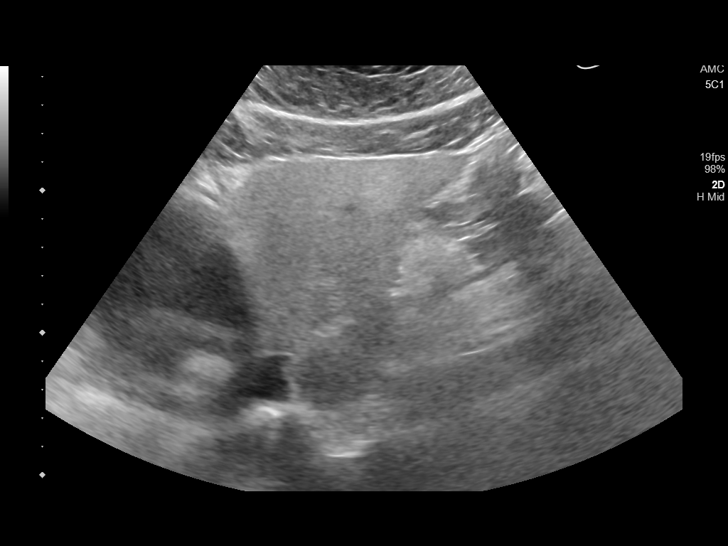
[im 14/25]
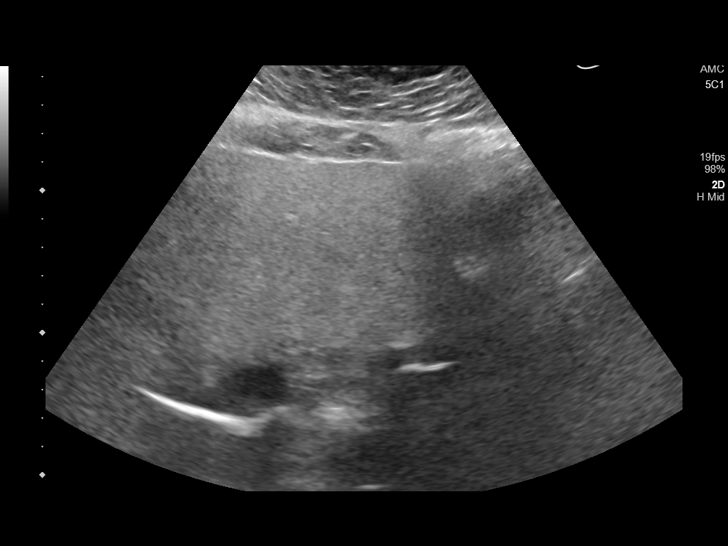
[im 16/25]
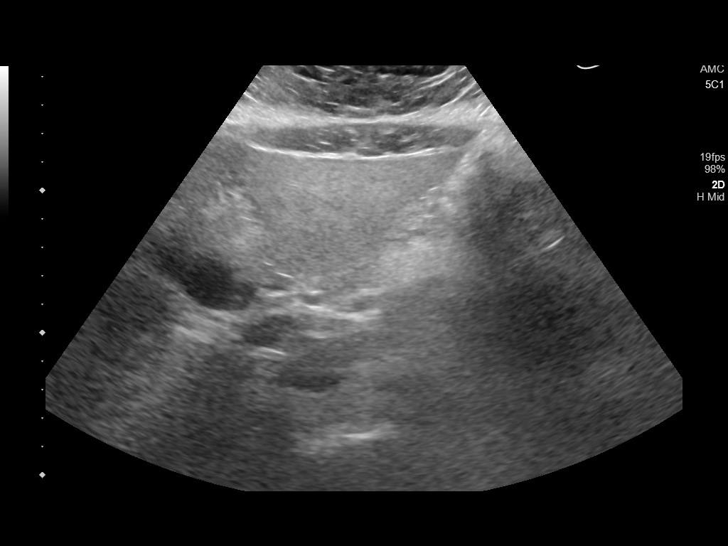
[im 17/25]
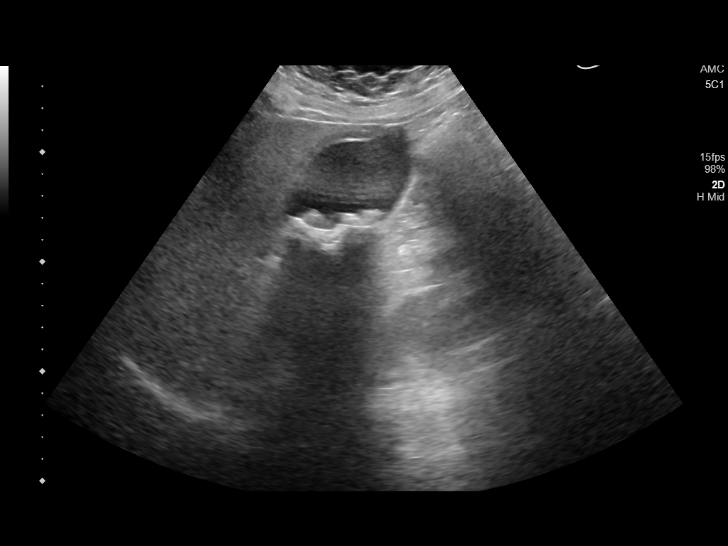
[im 19/25]
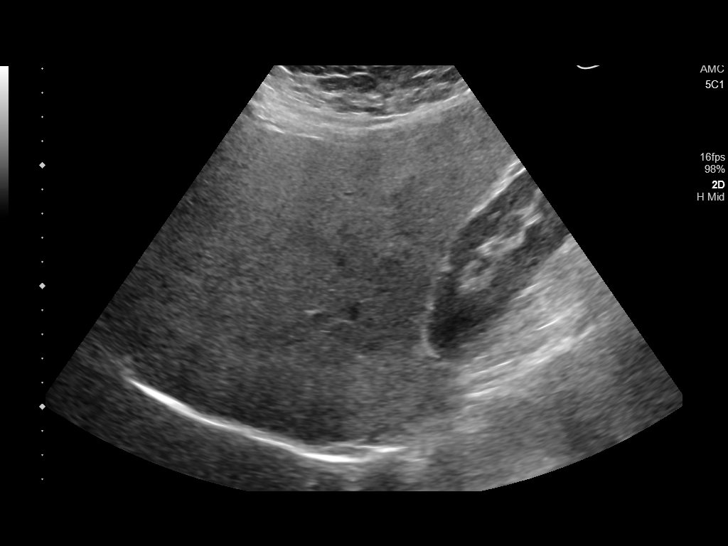
[im 21/25]
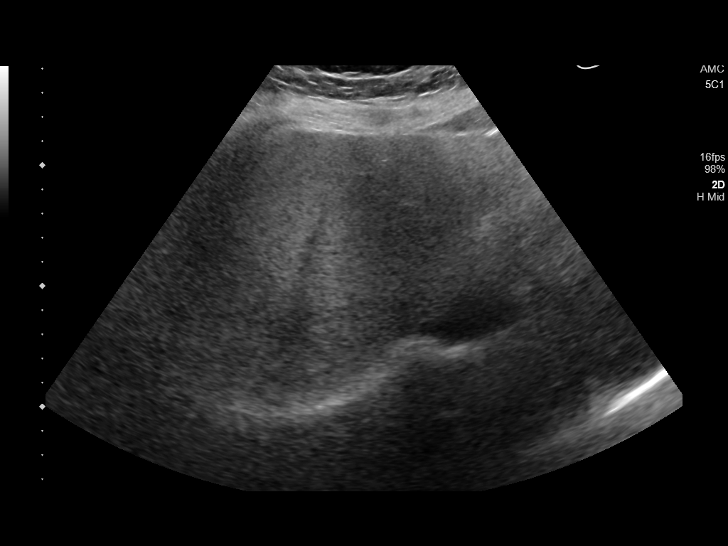
[im 23/25]
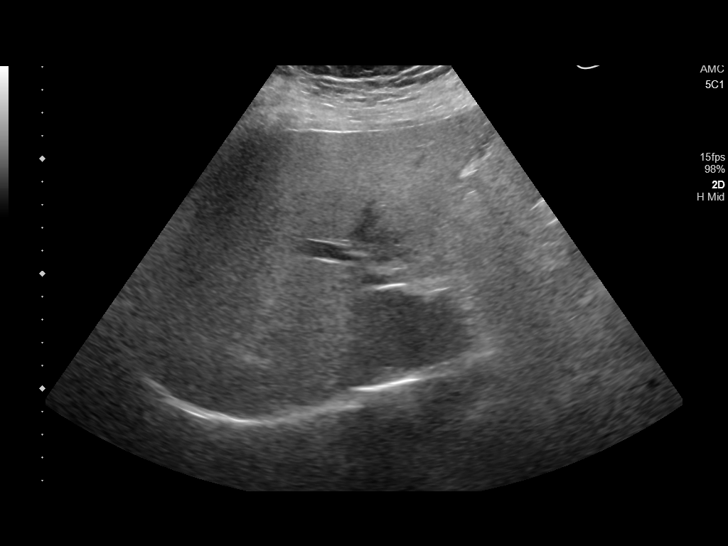
[im 25/25]
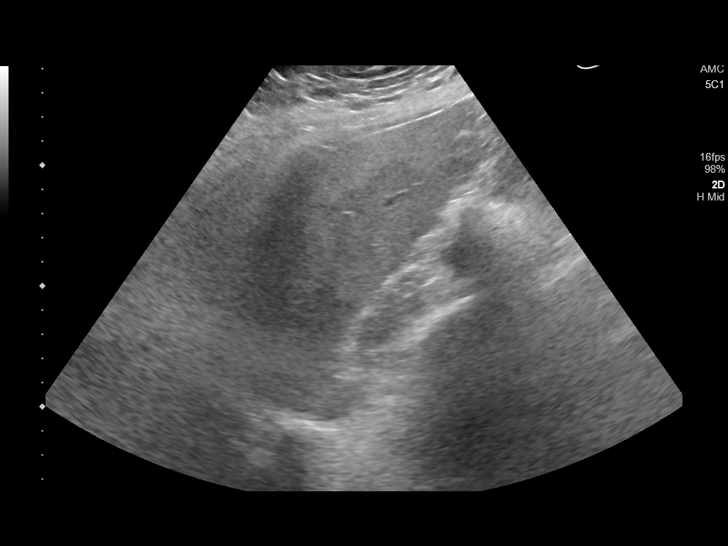

[14 of 25 positions shown; findings below may reference images not displayed]

FINDINGS: Gallbladder:

Within the gallbladder, there are echogenic foci which move and
shadow consistent with cholelithiasis. Largest gallstone measures 8
mm. No gallbladder wall thickening or pericholecystic fluid. No
sonographic Murphy sign noted by sonographer.

Common bile duct:

Diameter: 4 mm. No intrahepatic or extrahepatic biliary duct
dilatation

Liver:

No focal lesion identified. Liver echogenicity is increased
diffusely. Portal vein is patent on color Doppler imaging with
normal direction of blood flow towards the liver.

Other: None.
IMPRESSION: 1. Cholelithiasis. No gallbladder wall thickening or pericholecystic
fluid.

2. Diffuse increase in liver echogenicity, a finding indicative of
hepatic steatosis. While no focal liver lesions are evident on this
study, it must be cautioned that the sensitivity of ultrasound for
detection of focal liver lesions is diminished in this circumstance.

## 2021-05-25 DIAGNOSIS — Z1231 Encounter for screening mammogram for malignant neoplasm of breast: Secondary | ICD-10-CM | POA: Diagnosis not present

## 2021-05-25 LAB — HM MAMMOGRAPHY

## 2021-06-01 ENCOUNTER — Encounter: Payer: Self-pay | Admitting: Primary Care

## 2021-08-03 ENCOUNTER — Ambulatory Visit: Payer: BC Managed Care – PPO | Admitting: Podiatry

## 2021-08-10 ENCOUNTER — Other Ambulatory Visit: Payer: Self-pay

## 2021-08-10 ENCOUNTER — Ambulatory Visit (INDEPENDENT_AMBULATORY_CARE_PROVIDER_SITE_OTHER): Payer: BC Managed Care – PPO

## 2021-08-10 ENCOUNTER — Ambulatory Visit (INDEPENDENT_AMBULATORY_CARE_PROVIDER_SITE_OTHER): Payer: BC Managed Care – PPO | Admitting: Podiatry

## 2021-08-10 DIAGNOSIS — S9032XA Contusion of left foot, initial encounter: Secondary | ICD-10-CM

## 2021-08-10 MED ORDER — METHYLPREDNISOLONE 4 MG PO TBPK: ORAL_TABLET | ORAL | 0 refills | Status: DC

## 2021-08-10 NOTE — Progress Notes (Signed)
   HPI: 55 y.o. female presenting today for new complaint regarding pain and tenderness to the left foot has been going on since August.  Patient states that she stepped into a boat and "something happened" which caused severe pain all the way up into her leg.  She has not done anything for treatment and the pain has stayed.  Patient states that now currently it is not painful unless she is on her feet for a significant amount of time and then she will sit down.  Whenever she stands back up it becomes excruciating.  She presents for further treatment and evaluation  Past Medical History:  Diagnosis Date   Allergy    Cholelithiasis without cholecystitis 09/18/2019   Obesity      Physical Exam: General: The patient is alert and oriented x3 in no acute distress.  Dermatology: Skin is warm, dry and supple bilateral lower extremities. Negative for open lesions or macerations.  Vascular: Palpable pedal pulses bilaterally. No edema or erythema noted. Capillary refill within normal limits.  Neurological: Epicritic and protective threshold grossly intact bilaterally.   Musculoskeletal Exam: Range of motion within normal limits to all pedal and ankle joints bilateral. Muscle strength 5/5 in all groups bilateral.  Pain on palpation of the insertion of the peroneal tendon into the fifth metatarsal tubercle left  Radiographic Exam:  Normal osseous mineralization. Joint spaces preserved. No fracture/dislocation/boney destruction.    Assessment: 1.  Insertional peroneal tendinitis left x4 months   Plan of Care:  1. Patient evaluated. X-Rays reviewed.  2.  Cam boot dispensed.  Weightbearing as tolerated x3-4 weeks 3.  Prescription for Medrol Dosepak.  Patient has meloxicam at home.  Resume after completion of the Dosepak 4.  Return to clinic in 4 weeks      Edrick Kins, DPM [facility]  Dr. Edrick Kins, DPM    [address], [place] [address]                Office [phone]  Fax [phone]

## 2021-09-10 ENCOUNTER — Other Ambulatory Visit: Payer: Self-pay

## 2021-09-10 ENCOUNTER — Encounter: Payer: Self-pay | Admitting: Family Medicine

## 2021-09-10 ENCOUNTER — Other Ambulatory Visit: Payer: Self-pay | Admitting: Family Medicine

## 2021-09-10 ENCOUNTER — Telehealth (INDEPENDENT_AMBULATORY_CARE_PROVIDER_SITE_OTHER): Payer: BC Managed Care – PPO | Admitting: Family Medicine

## 2021-09-10 VITALS — Ht 63.5 in

## 2021-09-10 DIAGNOSIS — R509 Fever, unspecified: Secondary | ICD-10-CM

## 2021-09-10 DIAGNOSIS — R52 Pain, unspecified: Secondary | ICD-10-CM

## 2021-09-10 DIAGNOSIS — U071 COVID-19: Secondary | ICD-10-CM

## 2021-09-10 DIAGNOSIS — Z20822 Contact with and (suspected) exposure to covid-19: Secondary | ICD-10-CM

## 2021-09-10 NOTE — Progress Notes (Addendum)
Tara Boeh T. Kesley Gaffey, MD Primary Care and Sports Medicine [facility] at [facility] [place], [address] Phone: [phone]   FAX: [phone]  Tara Tanner - 56 y.o. female   MRN [id number]   Date of Birth: 1966/02/27  Visit Date: 09/10/2021   PCP: Pleas Koch, NP   Referred by: Pleas Koch, NP  Virtual Visit via Video Note:  I connected with  Tara Tanner on 09/10/2021  3:40 PM EST by a video enabled telemedicine application and verified that I am speaking with the correct person using two identifiers.   Location patient: home computer, tablet, or smartphone Location provider: work or home office Consent: Verbal consent directly obtained from [name]. Persons participating in the virtual visit: patient, provider  I discussed the limitations of evaluation and management by telemedicine and the availability of in person appointments. The patient expressed understanding and agreed to proceed.  Chief Complaint  Patient presents with   Fever    Symptoms started yesterday No Covid Test done   Headache   Chills   Cough   Scratchy Throat   Generalized Body Aches    History of Present Illness:  The patient started to have symptoms yesterday, and currently she has polyarthralgia, myalgia, and she has had a fever with a T-max of 102.  She also has some chills, coughing.  She has had a sore throat, but today this is somewhat better.  She also is having some generalized fatigue and she is not feeling well in general.  She has been taking some Tylenol and ibuprofen for fever, she is also been taking some Tylenol Cold and sinus.  This is helping a little bit.  BA Some food  ST improving [place]    Review of Systems as above: See pertinent positives and pertinent negatives per HPI No acute distress verbally   Observations/Objective/Exam:  An attempt was made to discern vital signs over the phone and per patient if  applicable and possible.   General:    Alert, Oriented, appears well and in no acute distress  Pulmonary:     On inspection no signs of respiratory distress.  Psych / Neurological:     Pleasant and cooperative.  Assessment and Plan:  Results for orders placed or performed in visit on 09/10/21  POC Influenza A&B(BINAX/QUICKVUE)  Result Value Ref Range   Influenza A, POC Negative Negative   Influenza B, POC Negative Negative  POC COVID-19 BinaxNow  Result Value Ref Range   SARS Coronavirus 2 Ag Positive (A) Negative       ICD-10-CM   1. COVID-19  U07.1 nirmatrelvir/ritonavir EUA (PAXLOVID) 20 x 150 MG & 10 x 100MG  TABS    2. Suspected COVID-19 virus infection  Z20.822 POC COVID-19 BinaxNow    CANCELED: Novel Coronavirus, NAA ([facility])    3. Body aches  R52 POC Influenza A&B(BINAX/QUICKVUE)    POC COVID-19 BinaxNow    CANCELED: Novel Coronavirus, NAA ([facility])    4. Fever, unspecified fever cause  R50.9 POC Influenza A&B(BINAX/QUICKVUE)    POC COVID-19 BinaxNow    CANCELED: Novel Coronavirus, NAA ([facility])     Probable COVID-19 versus influenza.  She is going to check at home test right now.  Given timing, I am also going to have her come to do a drive up influenza test as well as a rapid COVID test and if this is negative then to send off a PCR.  Additional intervention will depend upon findings of these test.  Addendum: 09/11/21 12:04 PM  Covid test came back positive this AM.  Will initiate antivirals.   I discussed the assessment and treatment plan with the patient. The patient was provided an opportunity to ask questions and all were answered. The patient agreed with the plan and demonstrated an understanding of the instructions.   The patient was advised to call back or seek an in-person evaluation if the symptoms worsen or if the condition fails to improve as anticipated.  Follow-up: prn unless noted otherwise below No follow-ups on file.  Meds ordered this  encounter  Medications   nirmatrelvir/ritonavir EUA (PAXLOVID) 20 x 150 MG & 10 x 100MG  TABS    Sig: Take 3 tablets by mouth 2 (two) times daily for 5 days. (Take nirmatrelvir 150 mg two tablets twice daily for 5 days and ritonavir 100 mg one tablet twice daily for 5 days) Patient GFR is 84.    Dispense:  30 tablet    Refill:  0   Orders Placed This Encounter  Procedures   POC Influenza A&B(BINAX/QUICKVUE)   POC COVID-19 BinaxNow    Signed,  Karlea Mckibbin T. Daimion Adamcik, MD

## 2021-09-10 NOTE — Progress Notes (Signed)
Patient scheduled to go to [facility] on 09/11/2021 at 9:00 am testing.  Back door information provided.

## 2021-09-11 ENCOUNTER — Encounter: Payer: Self-pay | Admitting: Family Medicine

## 2021-09-11 ENCOUNTER — Other Ambulatory Visit: Payer: BC Managed Care – PPO

## 2021-09-11 LAB — POC INFLUENZA A&B (BINAX/QUICKVUE)
Influenza A, POC: NEGATIVE
Influenza B, POC: NEGATIVE

## 2021-09-11 LAB — POC COVID19 BINAXNOW: SARS Coronavirus 2 Ag: POSITIVE — AB

## 2021-09-11 MED ORDER — NIRMATRELVIR/RITONAVIR (PAXLOVID)TABLET: 3.0000 | ORAL_TABLET | Freq: Two times a day (BID) | ORAL | 0 refills | Status: AC

## 2021-09-11 NOTE — Addendum Note (Signed)
Addended by: Loreen Freud on: 09/11/2021 09:21 AM   Modules accepted: Orders

## 2021-09-11 NOTE — Addendum Note (Signed)
Addended by: Owens Loffler on: 09/11/2021 12:04 PM   Modules accepted: Orders

## 2022-01-11 DIAGNOSIS — D225 Melanocytic nevi of trunk: Secondary | ICD-10-CM | POA: Diagnosis not present

## 2022-01-11 DIAGNOSIS — D2272 Melanocytic nevi of left lower limb, including hip: Secondary | ICD-10-CM | POA: Diagnosis not present

## 2022-01-11 DIAGNOSIS — D2261 Melanocytic nevi of right upper limb, including shoulder: Secondary | ICD-10-CM | POA: Diagnosis not present

## 2022-01-11 DIAGNOSIS — D2262 Melanocytic nevi of left upper limb, including shoulder: Secondary | ICD-10-CM | POA: Diagnosis not present

## 2022-09-16 ENCOUNTER — Other Ambulatory Visit: Payer: Self-pay | Admitting: Otolaryngology

## 2022-09-16 DIAGNOSIS — E041 Nontoxic single thyroid nodule: Secondary | ICD-10-CM

## 2022-10-06 ENCOUNTER — Ambulatory Visit
Admission: RE | Admit: 2022-10-06 | Discharge: 2022-10-06 | Disposition: A | Discharge status: Home / Self Care | Payer: BC Managed Care – PPO | Source: Ambulatory Visit | Attending: Otolaryngology | Admitting: Otolaryngology

## 2022-10-06 DIAGNOSIS — E041 Nontoxic single thyroid nodule: Secondary | ICD-10-CM | POA: Diagnosis not present

## 2022-10-11 ENCOUNTER — Ambulatory Visit (INDEPENDENT_AMBULATORY_CARE_PROVIDER_SITE_OTHER): Payer: BC Managed Care – PPO | Admitting: Podiatry

## 2022-10-11 ENCOUNTER — Ambulatory Visit: Payer: BC Managed Care – PPO

## 2022-10-11 DIAGNOSIS — M79671 Pain in right foot: Secondary | ICD-10-CM | POA: Diagnosis not present

## 2022-10-11 DIAGNOSIS — M67471 Ganglion, right ankle and foot: Secondary | ICD-10-CM

## 2022-10-11 NOTE — Progress Notes (Signed)
   Chief Complaint  Patient presents with   soft tissue     Right foot soft tisssue mass started 5 months ago, patient denies any pain, X-rays done today    HPI: 57 y.o. female presenting today for new complaint of a soft tissue mass that idiopathically developed to the lateral aspect of the right midfoot.  Denies a history of injury.  Although spontaneous presentation she states.  Is been present for about 5 months.  She suspects a possible ganglion cyst.  Past Medical History:  Diagnosis Date   Allergy    Cholelithiasis without cholecystitis 09/18/2019   Obesity     Past Surgical History:  Procedure Laterality Date   ABDOMINAL HYSTERECTOMY  06/2008   vag hys secondary to Uterine leiomyomata   CESAREAN SECTION  2001   DILATION AND CURETTAGE, DIAGNOSTIC / THERAPEUTIC     MYOMECTOMY     TONSILLECTOMY      Allergies  Allergen Reactions   Codeine Nausea And Vomiting and Other (See Comments)    Hallucinations, also     Physical Exam: General: The patient is alert and oriented x3 in no acute distress.  Dermatology: Skin is warm, dry and supple bilateral lower extremities. Negative for open lesions or macerations.  Vascular: Palpable pedal pulses bilaterally. Capillary refill within normal limits.  Negative for any significant edema or erythema  Neurological: Light touch and protective threshold grossly intact  Musculoskeletal Exam: No pedal deformities noted.  There is a fluctuant lesion noted to the lateral aspect of the right midfoot consistent with a ganglion cyst that measures approximately 2.5 cm in diameter   Assessment: 1.  Ganglion cyst right foot -Patient evaluated.  Pathology and etiology of ganglion cysts were explained in detail to the patient.  As well as different treatment options -Quick brisk pressure was applied to the ganglion cyst and the lesion was ruptured and the fluid within the ganglion cyst was resorbed into the surrounding soft tissue.  Patient was  very satisfied. -Recommend daily massage to the area with lotion to prevent recurrence -Return to clinic as needed     Edrick Kins, DPM [facility]  Dr. Edrick Kins, DPM    [address], [place] [address]                Office [phone]  Fax [phone]

## 2022-10-13 ENCOUNTER — Encounter: Payer: BC Managed Care – PPO | Admitting: Primary Care

## 2022-10-29 ENCOUNTER — Encounter: Payer: BC Managed Care – PPO | Admitting: Primary Care

## 2022-12-02 ENCOUNTER — Encounter: Payer: BC Managed Care – PPO | Admitting: Primary Care

## 2023-01-07 ENCOUNTER — Encounter: Payer: Self-pay | Admitting: Primary Care

## 2023-01-07 ENCOUNTER — Ambulatory Visit (INDEPENDENT_AMBULATORY_CARE_PROVIDER_SITE_OTHER): Payer: BC Managed Care – PPO | Admitting: Primary Care

## 2023-01-07 VITALS — BP 128/82 | HR 59 | Temp 97.2°F | Ht 63.5 in | Wt 241.0 lb

## 2023-01-07 DIAGNOSIS — Z0000 Encounter for general adult medical examination without abnormal findings: Secondary | ICD-10-CM

## 2023-01-07 DIAGNOSIS — R7989 Other specified abnormal findings of blood chemistry: Secondary | ICD-10-CM | POA: Diagnosis not present

## 2023-01-07 DIAGNOSIS — M722 Plantar fascial fibromatosis: Secondary | ICD-10-CM | POA: Diagnosis not present

## 2023-01-07 LAB — COMPREHENSIVE METABOLIC PANEL
ALT: 17 U/L (ref 0–35)
AST: 17 U/L (ref 0–37)
Albumin: 4.1 g/dL (ref 3.5–5.2)
Alkaline Phosphatase: 110 U/L (ref 39–117)
BUN: 21 mg/dL (ref 6–23)
CO2: 27 mEq/L (ref 19–32)
Calcium: 9.6 mg/dL (ref 8.4–10.5)
Chloride: 106 mEq/L (ref 96–112)
Creatinine, Ser: 0.84 mg/dL (ref 0.40–1.20)
GFR: 76.74 mL/min (ref >60.00)
Glucose, Bld: 97 mg/dL (ref 70–99)
Potassium: 4.2 mEq/L (ref 3.5–5.1)
Sodium: 140 mEq/L (ref 135–145)
Total Bilirubin: 0.6 mg/dL (ref 0.2–1.2)
Total Protein: 6.7 g/dL (ref 6.0–8.3)

## 2023-01-07 LAB — HEMOGLOBIN A1C: Hgb A1c MFr Bld: 5.2 % (ref 4.6–6.5)

## 2023-01-07 LAB — LIPID PANEL
Cholesterol: 144 mg/dL (ref 0–200)
HDL: 62 mg/dL (ref >39.00)
LDL Cholesterol: 70 mg/dL (ref 0–99)
NonHDL: 82.29
Total CHOL/HDL Ratio: 2
Triglycerides: 62 mg/dL (ref 0.0–149.0)
VLDL: 12.4 mg/dL (ref 0.0–40.0)

## 2023-01-07 LAB — TSH: TSH: 0.36 u[IU]/mL (ref 0.35–5.50)

## 2023-01-07 LAB — T4, FREE: Free T4: 1.03 ng/dL (ref 0.60–1.60)

## 2023-01-07 NOTE — Patient Instructions (Signed)
Stop by the lab prior to leaving today. I will notify you of your results once received.   Schedule your mammogram.  Schedule your Shingrix vaccines. The second one is 2-6 months after the first.   It was a pleasure to see you today!

## 2023-01-07 NOTE — Assessment & Plan Note (Addendum)
Repeat TSH and Free T4 pending. Asymptomatic.

## 2023-01-07 NOTE — Assessment & Plan Note (Signed)
Controlled.  Continue Meloxicam 15 mg PRN.

## 2023-01-07 NOTE — Progress Notes (Signed)
Subjective:    Patient ID: Tara Tanner, female    DOB: 01-28-1966, 57 y.o.   MRN: [id number]  HPI  Tara Tanner is a very pleasant 57 y.o. female who presents today for complete physical and follow up of chronic conditions.  Immunizations: -Tetanus: Completed in 2020 -Shingles: never completed, declines today  Diet: Fair diet.  Exercise: No regular exercise.  Eye exam: Completed >1 year ago Dental exam: Completes semi-annually    Pap Smear: Hysterectomy  Mammogram: Completed in 2022  Colonoscopy: Completed in 2019, due 2029  BP Readings from Last 3 Encounters:  01/07/23 128/82  12/25/20 118/76  09/24/19 130/82       Review of Systems  Constitutional:  Negative for unexpected weight change.  HENT:  Negative for rhinorrhea.   Respiratory:  Negative for cough and shortness of breath.   Cardiovascular:  Negative for chest pain.  Gastrointestinal:  Negative for constipation and diarrhea.  Genitourinary:  Negative for difficulty urinating.  Musculoskeletal:  Negative for arthralgias and myalgias.  Skin:  Negative for rash.  Allergic/Immunologic: Negative for environmental allergies.  Neurological:  Negative for dizziness and headaches.  Psychiatric/Behavioral:  The patient is not nervous/anxious.          Past Medical History:  Diagnosis Date   Allergy    Cholelithiasis without cholecystitis 09/18/2019   Microscopic hematuria 12/25/2020   Obesity     Social History   Socioeconomic History   Marital status: Married    Spouse name: Not on file   Number of children: Not on file   Years of education: Not on file   Highest education level: Not on file  Occupational History   Not on file  Tobacco Use   Smoking status: Never   Smokeless tobacco: Never  Substance and Sexual Activity   Alcohol use: No   Drug use: No   Sexual activity: Not on file  Other Topics Concern   Not on file  Social History Narrative   Married.   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   1 daughter.   [place]jo[place] traveling, [place]   Social Determinants of Health   Financial Resource Strain: Not on file  Food Insecurity: Not on file  Transportation Needs: Not on file  Physical Activity: Not on file  Stress: Not on file  Social Connections: Not on file  Intimate Partner Violence: Not on file    Past Surgical History:  Procedure Laterality Date   ABDOMINAL HYSTERECTOMY  06/2008   vag hys secondary to Uterine leiomyomata   CESAREAN SECTION  2001   DILATION AND CURETTAGE, DIAGNOSTIC / THERAPEUTIC     MYOMECTOMY     TONSILLECTOMY      Family History  Problem Relation Age of Onset   Stroke Mother    Alzheimer's disease Mother    Kidney cancer Father    Rheum arthritis Father    Colon cancer Neg Hx    Esophageal cancer Neg Hx    Stomach cancer Neg Hx    Rectal cancer Neg Hx     Allergies  Allergen Reactions   Codeine Nausea And Vomiting and Other (See Comments)    Hallucinations, also    Current Outpatient Medications on File Prior to Visit  Medication Sig Dispense Refill   ibuprofen (ADVIL) 200 MG tablet Take 400 mg by mouth every 6 (six) hours as needed for headache.     acetaminophen (TYLENOL) 500 MG tablet Take 500-1,000 mg by  mouth every 6 (six) hours as needed for headache.     meloxicam (MOBIC) 15 MG tablet Take 15 mg by mouth daily as needed for pain. (Patient not taking: Reported on 01/07/2023)     No current facility-administered medications on file prior to visit.    BP 128/82   Pulse (!) 59   Temp (!) 97.2 F (36.2 C) (Temporal)   Ht 5' 3.5" (1.613 m)   Wt 241 lb (109.3 kg)   SpO2 98%   BMI 42.02 kg/m  Objective:   Physical Exam HENT:     Right Ear: Tympanic membrane and ear canal normal.     Left Ear: Tympanic membrane and ear canal normal.     Nose: Nose normal.  Eyes:     Conjunctiva/sclera: Conjunctivae normal.     Pupils: Pupils are equal, round, and reactive to light.  Neck:      Thyroid: No thyromegaly.  Cardiovascular:     Rate and Rhythm: Normal rate and regular rhythm.     Heart sounds: No murmur heard. Pulmonary:     Effort: Pulmonary effort is normal.     Breath sounds: Normal breath sounds. No rales.  Abdominal:     General: Bowel sounds are normal.     Palpations: Abdomen is soft.     Tenderness: There is no abdominal tenderness.  Musculoskeletal:        General: Normal range of motion.     Cervical back: Neck supple.  Lymphadenopathy:     Cervical: No cervical adenopathy.  Skin:    General: Skin is warm and dry.     Findings: No rash.  Neurological:     Mental Status: She is alert and oriented to person, place, and time.     Cranial Nerves: No cranial nerve deficit.     Deep Tendon Reflexes: Reflexes are normal and symmetric.  Psychiatric:        Mood and Affect: Mood normal.           Assessment & Plan:  Preventative health care Assessment & Plan: She will schedule the Shingrix vaccines. Tetanus UTD Mammogram due, she will have this done at [facility], does not need an order Colonoscopy UTD, due 2029  Discussed the importance of a healthy diet and regular exercise in order for weight loss, and to reduce the risk of further co-morbidity.  Exam stable. Labs pending.  Follow up in 1 year for repeat physical.   Orders: -     Lipid panel -     Hemoglobin A1c -     Comprehensive metabolic panel  Plantar fasciitis Assessment & Plan: Controlled.  Continue Meloxicam 15 mg PRN.   Low TSH level Assessment & Plan: Repeat TSH and Free T4 pending. Asymptomatic.   Orders: -     TSH -     T4, free        Doreene Nest, NP

## 2023-01-07 NOTE — Assessment & Plan Note (Signed)
She will schedule the Shingrix vaccines. Tetanus UTD Mammogram due, she will have this done at [facility], does not need an order Colonoscopy UTD, due 2029  Discussed the importance of a healthy diet and regular exercise in order for weight loss, and to reduce the risk of further co-morbidity.  Exam stable. Labs pending.  Follow up in 1 year for repeat physical.

## 2023-01-18 DIAGNOSIS — D2272 Melanocytic nevi of left lower limb, including hip: Secondary | ICD-10-CM | POA: Diagnosis not present

## 2023-01-18 DIAGNOSIS — D2262 Melanocytic nevi of left upper limb, including shoulder: Secondary | ICD-10-CM | POA: Diagnosis not present

## 2023-01-18 DIAGNOSIS — D2261 Melanocytic nevi of right upper limb, including shoulder: Secondary | ICD-10-CM | POA: Diagnosis not present

## 2023-01-18 DIAGNOSIS — D225 Melanocytic nevi of trunk: Secondary | ICD-10-CM | POA: Diagnosis not present

## 2023-01-20 DIAGNOSIS — Z1231 Encounter for screening mammogram for malignant neoplasm of breast: Secondary | ICD-10-CM | POA: Diagnosis not present

## 2023-01-20 LAB — HM MAMMOGRAPHY

## 2023-01-21 ENCOUNTER — Encounter: Payer: Self-pay | Admitting: Primary Care

## 2023-02-02 ENCOUNTER — Ambulatory Visit: Payer: BC Managed Care – PPO

## 2023-11-28 DIAGNOSIS — M8580 Other specified disorders of bone density and structure, unspecified site: Secondary | ICD-10-CM | POA: Diagnosis not present

## 2023-11-28 DIAGNOSIS — M272 Inflammatory conditions of jaws: Secondary | ICD-10-CM | POA: Diagnosis not present

## 2023-11-28 DIAGNOSIS — K091 Developmental (nonodontogenic) cysts of oral region: Secondary | ICD-10-CM | POA: Diagnosis not present

## 2024-01-10 ENCOUNTER — Ambulatory Visit (INDEPENDENT_AMBULATORY_CARE_PROVIDER_SITE_OTHER): Admitting: Primary Care

## 2024-01-10 ENCOUNTER — Other Ambulatory Visit: Payer: Self-pay | Admitting: Primary Care

## 2024-01-10 ENCOUNTER — Ambulatory Visit: Payer: Self-pay | Admitting: Primary Care

## 2024-01-10 ENCOUNTER — Encounter: Payer: Self-pay | Admitting: Primary Care

## 2024-01-10 VITALS — BP 124/80 | HR 63 | Temp 97.2°F | Ht 63.5 in | Wt 243.0 lb

## 2024-01-10 DIAGNOSIS — Z6841 Body Mass Index (BMI) 40.0 and over, adult: Secondary | ICD-10-CM

## 2024-01-10 DIAGNOSIS — E66813 Obesity, class 3: Secondary | ICD-10-CM | POA: Diagnosis not present

## 2024-01-10 DIAGNOSIS — R7989 Other specified abnormal findings of blood chemistry: Secondary | ICD-10-CM

## 2024-01-10 DIAGNOSIS — R946 Abnormal results of thyroid function studies: Secondary | ICD-10-CM

## 2024-01-10 DIAGNOSIS — Z0000 Encounter for general adult medical examination without abnormal findings: Secondary | ICD-10-CM

## 2024-01-10 DIAGNOSIS — Z23 Encounter for immunization: Secondary | ICD-10-CM | POA: Diagnosis not present

## 2024-01-10 DIAGNOSIS — R739 Hyperglycemia, unspecified: Secondary | ICD-10-CM

## 2024-01-10 LAB — CBC
HCT: 41.9 % (ref 36.0–46.0)
Hemoglobin: 14.2 g/dL (ref 12.0–15.0)
MCHC: 33.9 g/dL (ref 30.0–36.0)
MCV: 87.2 fl (ref 78.0–100.0)
Platelets: 336 10*3/uL (ref 150.0–400.0)
RBC: 4.8 Mil/uL (ref 3.87–5.11)
RDW: 13.6 % (ref 11.5–15.5)
WBC: 6.9 10*3/uL (ref 4.0–10.5)

## 2024-01-10 LAB — LIPID PANEL
Cholesterol: 145 mg/dL (ref 0–200)
HDL: 63.2 mg/dL (ref >39.00)
LDL Cholesterol: 67 mg/dL (ref 0–99)
NonHDL: 81.3
Total CHOL/HDL Ratio: 2
Triglycerides: 73 mg/dL (ref 0.0–149.0)
VLDL: 14.6 mg/dL (ref 0.0–40.0)

## 2024-01-10 LAB — COMPREHENSIVE METABOLIC PANEL WITH GFR
ALT: 21 U/L (ref 0–35)
AST: 20 U/L (ref 0–37)
Albumin: 4.3 g/dL (ref 3.5–5.2)
Alkaline Phosphatase: 106 U/L (ref 39–117)
BUN: 19 mg/dL (ref 6–23)
CO2: 27 meq/L (ref 19–32)
Calcium: 9.7 mg/dL (ref 8.4–10.5)
Chloride: 105 meq/L (ref 96–112)
Creatinine, Ser: 0.82 mg/dL (ref 0.40–1.20)
GFR: 78.43 mL/min (ref >60.00)
Glucose, Bld: 108 mg/dL — ABNORMAL HIGH (ref 70–99)
Potassium: 4.4 meq/L (ref 3.5–5.1)
Sodium: 139 meq/L (ref 135–145)
Total Bilirubin: 0.6 mg/dL (ref 0.2–1.2)
Total Protein: 6.9 g/dL (ref 6.0–8.3)

## 2024-01-10 LAB — T4, FREE: Free T4: 0.83 ng/dL (ref 0.60–1.60)

## 2024-01-10 LAB — TSH: TSH: 0.31 u[IU]/mL — ABNORMAL LOW (ref 0.35–5.50)

## 2024-01-10 NOTE — Progress Notes (Signed)
 Subjective:    Patient ID: Tara Tanner, female    DOB: 1965/10/01, 58 y.o.   MRN: [id number]  HPI  Tara Tanner is a very pleasant 58 y.o. female who presents today for complete physical and follow up of chronic conditions.  Immunizations: -Tetanus: Completed in 2020  -Shingles: Never completed    Diet: Fair diet.  Exercise: Walking   Eye exam: Completes annually  Dental exam: Completes semi-annually    Pap Smear: hysterectomy  Mammogram: Completed in May 2024  Colonoscopy: Completed in 2019, due 2029  BP Readings from Last 3 Encounters:  01/10/24 124/80  01/07/23 128/82  12/25/20 118/76   Body mass index is 42.37 kg/m.     Review of Systems  Constitutional:  Negative for unexpected weight change.  HENT:  Negative for rhinorrhea.   Respiratory:  Negative for cough and shortness of breath.   Cardiovascular:  Negative for chest pain.  Gastrointestinal:  Negative for constipation and diarrhea.  Genitourinary:  Negative for difficulty urinating.  Musculoskeletal:  Negative for arthralgias and myalgias.  Skin:  Negative for rash.  Allergic/Immunologic: Negative for environmental allergies.  Neurological:  Negative for dizziness, numbness and headaches.  Psychiatric/Behavioral:  The patient is not nervous/anxious.          Past Medical History:  Diagnosis Date   Allergy    Cholelithiasis without cholecystitis 09/18/2019   Microscopic hematuria 12/25/2020   Obesity     Social History   Socioeconomic History   Marital status: Married    Spouse name: Not on file   Number of children: Not on file   Years of education: Not on file   Highest education level: Not on file  Occupational History   Not on file  Tobacco Use   Smoking status: Never   Smokeless tobacco: Never  Substance and Sexual Activity   Alcohol use: No   Drug use: No   Sexual activity: Not on file  Other Topics Concern   Not on file  Social History Narrative   Married.   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   1 daughter.   Enjo[place] traveling, [place].   Social Drivers of Corporate investment banker Strain: Not on file  Food Insecurity: Not on file  Transportation Needs: Not on file  Physical Activity: Not on file  Stress: Not on file  Social Connections: Not on file  Intimate Partner Violence: Not on file    Past Surgical History:  Procedure Laterality Date   ABDOMINAL HYSTERECTOMY  06/2008   vag hys secondary to Uterine leiomyomata   CESAREAN SECTION  2001   DILATION AND CURETTAGE, DIAGNOSTIC / THERAPEUTIC     MYOMECTOMY     TONSILLECTOMY      Family History  Problem Relation Age of Onset   Stroke Mother    Alzheimer's disease Mother    Kidney cancer Father    Rheum arthritis Father    Colon cancer Neg Hx    Esophageal cancer Neg Hx    Stomach cancer Neg Hx    Rectal cancer Neg Hx     Allergies  Allergen Reactions   Codeine Nausea And Vomiting and Other (See Comments)    Hallucinations, also    Current Outpatient Medications on File Prior to Visit  Medication Sig Dispense Refill   ibuprofen (ADVIL) 200 MG tablet Take 400 mg by mouth every 6 (six) hours as needed for headache.     No current  facility-administered medications on file prior to visit.    BP 124/80   Pulse 63   Temp (!) 97.2 F (36.2 C) (Temporal)   Ht 5' 3.5" (1.613 m)   Wt 243 lb (110.2 kg)   SpO2 97%   BMI 42.37 kg/m  Objective:   Physical Exam HENT:     Right Ear: Tympanic membrane and ear canal normal.     Left Ear: Tympanic membrane and ear canal normal.  Eyes:     Pupils: Pupils are equal, round, and reactive to light.  Cardiovascular:     Rate and Rhythm: Normal rate and regular rhythm.  Pulmonary:     Effort: Pulmonary effort is normal.     Breath sounds: Normal breath sounds.  Abdominal:     General: Bowel sounds are normal.     Palpations: Abdomen is soft.     Tenderness: There is no abdominal tenderness.   Musculoskeletal:        General: Normal range of motion.     Cervical back: Neck supple.  Skin:    General: Skin is warm and dry.  Neurological:     Mental Status: She is alert and oriented to person, place, and time.     Cranial Nerves: No cranial nerve deficit.     Deep Tendon Reflexes:     Reflex Scores:      Patellar reflexes are 2+ on the right side and 2+ on the left side. Psychiatric:        Mood and Affect: Mood normal.           Assessment & Plan:  Preventative health care Assessment & Plan: First Shingrix vaccine provided today. Discussed instructions for second vaccine. Mammogram due, she will schedule Colonoscopy UTD, due 2029  Discussed the importance of a healthy diet and regular exercise in order for weight loss, and to reduce the risk of further co-morbidity.  Exam stable. Labs pending.  Follow up in 1 year for repeat physical.   Orders: -     Comprehensive metabolic panel with GFR -     Lipid panel -     CBC  Low TSH level Assessment & Plan: Asymptomatic.  Repeat thyroid  studies pending.  Orders: -     TSH -     T4, free  Class 3 severe obesity due to excess calories with body mass index (BMI) of 40.0 to 44.9 in adult Assessment & Plan: Referral placed to healthy weight and wellness center in [place].  Orders: -     Amb Ref to Medical Weight Management        Gabriel John, NP

## 2024-01-10 NOTE — Assessment & Plan Note (Signed)
Asymptomatic.  Repeat thyroid studies pending.

## 2024-01-10 NOTE — Assessment & Plan Note (Signed)
 First Shingrix vaccine provided today. Discussed instructions for second vaccine. Mammogram due, she will schedule Colonoscopy UTD, due 2029  Discussed the importance of a healthy diet and regular exercise in order for weight loss, and to reduce the risk of further co-morbidity.  Exam stable. Labs pending.  Follow up in 1 year for repeat physical.

## 2024-01-10 NOTE — Patient Instructions (Signed)
 Stop by the lab prior to leaving today. I will notify you of your results once received.   Schedule a nurse visit for your second shingles vaccine 2 to 6 months from now.  It was a pleasure to see you today!

## 2024-01-10 NOTE — Assessment & Plan Note (Signed)
 Referral placed to healthy weight and wellness center in [place].

## 2024-01-11 ENCOUNTER — Ambulatory Visit: Payer: Self-pay | Admitting: Primary Care

## 2024-01-11 ENCOUNTER — Ambulatory Visit (INDEPENDENT_AMBULATORY_CARE_PROVIDER_SITE_OTHER)

## 2024-01-11 ENCOUNTER — Encounter (INDEPENDENT_AMBULATORY_CARE_PROVIDER_SITE_OTHER): Payer: Self-pay

## 2024-01-11 DIAGNOSIS — R739 Hyperglycemia, unspecified: Secondary | ICD-10-CM | POA: Diagnosis not present

## 2024-01-11 LAB — HEMOGLOBIN A1C: Hgb A1c MFr Bld: 5.2 % (ref 4.6–6.5)

## 2024-02-27 ENCOUNTER — Encounter (INDEPENDENT_AMBULATORY_CARE_PROVIDER_SITE_OTHER): Payer: Self-pay

## 2024-05-09 DIAGNOSIS — D1801 Hemangioma of skin and subcutaneous tissue: Secondary | ICD-10-CM | POA: Diagnosis not present

## 2024-05-09 DIAGNOSIS — L820 Inflamed seborrheic keratosis: Secondary | ICD-10-CM | POA: Diagnosis not present

## 2024-05-09 DIAGNOSIS — D2261 Melanocytic nevi of right upper limb, including shoulder: Secondary | ICD-10-CM | POA: Diagnosis not present

## 2024-05-09 DIAGNOSIS — R208 Other disturbances of skin sensation: Secondary | ICD-10-CM | POA: Diagnosis not present

## 2024-05-09 DIAGNOSIS — D2262 Melanocytic nevi of left upper limb, including shoulder: Secondary | ICD-10-CM | POA: Diagnosis not present

## 2024-05-09 DIAGNOSIS — L538 Other specified erythematous conditions: Secondary | ICD-10-CM | POA: Diagnosis not present

## 2024-05-09 DIAGNOSIS — D2272 Melanocytic nevi of left lower limb, including hip: Secondary | ICD-10-CM | POA: Diagnosis not present

## 2024-05-09 DIAGNOSIS — D225 Melanocytic nevi of trunk: Secondary | ICD-10-CM | POA: Diagnosis not present

## 2024-05-09 DIAGNOSIS — D485 Neoplasm of uncertain behavior of skin: Secondary | ICD-10-CM | POA: Diagnosis not present

## 2024-05-10 ENCOUNTER — Telehealth: Payer: Self-pay

## 2024-05-10 NOTE — Telephone Encounter (Signed)
 Called patient she has been scheduled for her 2nd shingles shot appt, no further questions.

## 2024-05-10 NOTE — Telephone Encounter (Signed)
 Copied from CRM #[id number]. Topic: Appointments - Scheduling Inquiry for Clinic >> May 09, 2024  4:36 PM [name] wrote: Reason for CRM: Patient requests a nurse return her call, concerning scheduling a shingles shot appnt.

## 2024-05-15 ENCOUNTER — Ambulatory Visit (INDEPENDENT_AMBULATORY_CARE_PROVIDER_SITE_OTHER)

## 2024-05-15 DIAGNOSIS — Z23 Encounter for immunization: Secondary | ICD-10-CM | POA: Diagnosis not present

## 2024-05-15 NOTE — Progress Notes (Signed)
 Per orders of Dr. Laine Balls, injection of Shingles  vaccine given by Bobbette Sprague in left deltoid. Patient tolerated injection well.

## 2024-07-04 ENCOUNTER — Ambulatory Visit: Payer: Self-pay

## 2024-07-04 ENCOUNTER — Encounter: Payer: Self-pay | Admitting: Family Medicine

## 2024-07-04 ENCOUNTER — Ambulatory Visit: Admitting: Family Medicine

## 2024-07-04 VITALS — BP 136/70 | HR 85 | Temp 99.1°F | Ht 63.5 in

## 2024-07-04 DIAGNOSIS — J029 Acute pharyngitis, unspecified: Secondary | ICD-10-CM

## 2024-07-04 DIAGNOSIS — R509 Fever, unspecified: Secondary | ICD-10-CM | POA: Diagnosis not present

## 2024-07-04 DIAGNOSIS — U071 COVID-19: Secondary | ICD-10-CM

## 2024-07-04 LAB — POCT INFLUENZA A/B
Influenza A, POC: NEGATIVE
Influenza B, POC: NEGATIVE

## 2024-07-04 LAB — POCT RAPID STREP A (OFFICE): Rapid Strep A Screen: NEGATIVE

## 2024-07-04 LAB — POC COVID19 BINAXNOW: SARS Coronavirus 2 Ag: POSITIVE — AB

## 2024-07-04 NOTE — Telephone Encounter (Signed)
 FYI Only or Action Required?: FYI only for provider: appointment scheduled on today.  Patient was last seen in primary care on 01/10/2024 by Gretta Comer POUR, NP.  Called Nurse Triage reporting Sore Throat.  Symptoms began Monday.  Interventions attempted: OTC medications: tylenol .  Symptoms are: gradually worsening.  Triage Disposition: See Physician Within 24 Hours  Patient/caregiver understands and will follow disposition?: Yes    Copied from CRM #[id number]. Topic: Clinical - Red Word Triage >> Jul 04, 2024  8:02 AM Harlene ORN wrote: Red Word that prompted transfer to Nurse Triage: sore throat, chills, and body aches Reason for Disposition  SEVERE throat pain (e.g., excruciating)  Answer Assessment - Initial Assessment Questions 1. ONSET: When did the throat start hurting? (Hours or days ago)      Monday 2. SEVERITY: How bad is the sore throat? (Scale 1-10; mild, moderate or severe)     mild 3. STREP EXPOSURE: Has there been any exposure to strep within the past week? If Yes, ask: What type of contact occurred?      na 4.  VIRAL SYMPTOMS: Are there any symptoms of a cold, such as a runny nose, cough, hoarse voice or red eyes?      Stuffy nose, runny nose, hoarse,  5. FEVER: Do you have a fever? If Yes, ask: What is your temperature, how was it measured, and when did it start?     no 6. PUS ON THE TONSILS: Is there pus on the tonsils in the back of your throat?     no 7. OTHER SYMPTOMS: Do you have any other symptoms? (e.g., difficulty breathing, headache, rash)   Severe - Headache-front face, left earache, chills, body aches, head and chest congestion 8. PREGNANCY: Is there any chance you are pregnant? When was your last menstrual period?     na  Protocols used: Sore Throat-A-AH

## 2024-07-04 NOTE — Telephone Encounter (Signed)
 Appt scheduled

## 2024-07-04 NOTE — Progress Notes (Signed)
 Tara Cellucci T. Reymond Maynez, MD, CAQ Sports Medicine [facility] [address] [place], [address]  Phone: [phone]  FAX: [phone]  [name] - 58 y.o. female  MRN [id number]  Date of Birth: October 17, 1965  Date: 07/04/2024  PCP: Gretta Comer POUR, NP  Referral: Gretta Comer POUR, NP  Chief Complaint  Patient presents with   Sore Throat    Scratchy; started Sunday; woke up today with also body aches, congestion, chills and worse headache   Headache   Subjective:   Tara Tanner is a 58 y.o. very pleasant female patient with Body mass index is 42.37 kg/m. who presents with the following:  Discussed the use of AI scribe software for clinical note transcription with the patient, who gave verbal consent to proceed. History of Present Illness Tara Tanner is a 58 year old female who presents with body aches and congestion.  She has been experiencing generalized body aches since Monday, two days ago, with difficulty getting warm, although she has not measured her temperature. She is unsure if she has had a fever, as she has not measured her temperature, but described difficulty getting warm during the night.  She has significant congestion without sputum production, sinus pressure, and a sore throat that has improved. She experienced left ear pain yesterday, which has since resolved. No nausea, diarrhea, vomiting, rashes, or dysuria.  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 She received a flu shot this year and has had the initial COVID-19 vaccine and two boosters, although not recently.  Immunization History  Administered Date(s) Administered   Influenza,inj,Quad PF,6+ Mos 07/12/2019, 06/26/2020   Tdap 07/02/2019   Zoster Recombinant(Shingrix ) 01/10/2024, 05/15/2024      Review of Systems is noted in the HPI, as appropriate  Objective:   BP 136/70   Pulse 85    Temp 99.1 F (37.3 C)   Ht 5' 3.5 (1.613 m)   SpO2 98%   BMI 42.37 kg/m   Gen: WDWN, NAD. Globally Non-toxic HEENT: Throat clear, w/o exudate, R TM clear, L TM - good landmarks, No fluid present. rhinnorhea.  MMM Frontal sinuses: NT Max sinuses: NT NECK: Anterior cervical  LAD is absent CV: RRR, No M/G/R, cap refill <2 sec PULM: Breathing comfortably in no respiratory distress. no wheezing, crackles, rhonchi   Laboratory and Imaging Data: Results for orders placed or performed in visit on 07/04/24  POC COVID-19   Collection Time: 07/04/24 11:31 AM  Result Value Ref Range   SARS Coronavirus 2 Ag Positive (A) Negative  POCT rapid strep A   Collection Time: 07/04/24 11:32 AM  Result Value Ref Range   Rapid Strep A Screen Negative Negative  POCT Influenza A/B   Collection Time: 07/04/24 11:32 AM  Result Value Ref Range   Influenza A, POC Negative Negative   Influenza B, POC Negative Negative     Assessment and Plan:     ICD-10-CM   1. COVID-19  U07.1     2. Sore throat  J02.9 POC COVID-19    POCT rapid strep A    POCT Influenza A/B    3. Fever, unspecified fever cause  R50.9 POC COVID-19    POCT rapid strep A    POCT Influenza A/B     Assessment & Plan Acute COVID-19 Symptoms include body aches, congestion, and sore throat. No fever confirmed. Left ear pain resolved. Low risk patient.  She will continue supportive care including NyQuil, DayQuil, and any other cough or cold medicine as needed. - Out of work until Monday  Medication Management during today's office visit: No orders of the defined types were placed in this encounter.  There are no discontinued medications.  Orders placed today for conditions managed today: Orders Placed This Encounter  Procedures   POC COVID-19   POCT rapid strep A   POCT Influenza A/B    Disposition: No follow-ups on file.  [employer] One speech-to-text software was used for transcription in this dictation.  Possible  transcriptional errors can occur using Animal nutritionist.   Signed,  Jacques DASEN. Azadeh Hyder, MD   Outpatient Encounter Medications as of 07/04/2024  Medication Sig   ibuprofen (ADVIL) 200 MG tablet Take 400 mg by mouth every 6 (six) hours as needed for headache.   No facility-administered encounter medications on file as of 07/04/2024.

## 2024-07-11 ENCOUNTER — Ambulatory Visit: Payer: Self-pay

## 2024-07-11 NOTE — Telephone Encounter (Signed)
 FYI Only or Action Required?: Action required by provider: clinical question for provider and update on patient condition. Request for medication/antibiotic to be called in  Patient was last seen in primary care on 07/04/2024 by Watt Mirza, MD.  Called Nurse Triage reporting Facial Pain.  Symptoms began several days ago.  Interventions attempted: OTC medications: tylenol /ibuprofen.  Symptoms are: unchanged.  Triage Disposition: See PCP When Office is Open (Within 3 Days)  Patient/caregiver understands and will follow disposition?: Yes   Copied from CRM [id number]. Topic: Clinical - Red Word Triage >> Jul 11, 2024  3:57 PM Viola F wrote: Patient was seen 07/04/24 - says she has a sinus infection - she's having pressure in sinus and burning/achyness - would like a medication sent to pharmacy Reason for Disposition  [1] Sinus congestion (pressure, fullness) AND [2] present > 10 days  Answer Assessment - Initial Assessment Questions Patient requests for medication to be called in to [facility] in [place]. Declined appointment at this time   1. LOCATION: Where does it hurt?      Cheek/face pain 2. ONSET: When did the sinus pain start?  (e.g., hours, days)      Dx with covid last week, sinus pain and pressure started [place] 3. SEVERITY: How bad is the pain?   (Scale 0-10; or none, mild, moderate or severe)     8/10 at worse 4. RECURRENT SYMPTOM: Have you ever had sinus problems before? If Yes, ask: When was the last time? and What happened that time?      Not prone to sinus infections 5. NASAL CONGESTION: Is the nose blocked? If Yes, ask: Can you open it or must you breathe through your mouth?     Runny and stopped up 6. NASAL DISCHARGE: Do you have discharge from your nose? If so ask, What color?     Green and yellow 7. FEVER: Do you have a fever? If Yes, ask: What is it, how was it measured, and when did it start?      denies 8. OTHER SYMPTOMS: Do you  have any other symptoms? (e.g., sore throat, cough, earache, difficulty breathing)     Post nasal drip/cough  Protocols used: Sinus Pain or Congestion-A-AH

## 2024-07-12 ENCOUNTER — Other Ambulatory Visit: Payer: Self-pay | Admitting: Family Medicine

## 2024-07-12 ENCOUNTER — Telehealth: Payer: Self-pay

## 2024-07-12 MED ORDER — AMOXICILLIN-POT CLAVULANATE 875-125 MG PO TABS
1.0000 | ORAL_TABLET | Freq: Two times a day (BID) | ORAL | 0 refills | Status: AC
Start: 2024-07-12 — End: ?

## 2024-07-12 NOTE — Telephone Encounter (Signed)
 I just received this message a few minutes ago.  The patient has acute COVID-19.  Most likely, all of her symptoms are from COVID-19.  I cannot fully exclude a sinus infection, but it would be less likely.  I sent her in some Augmentin  to take for the next 10 days.

## 2024-07-12 NOTE — Telephone Encounter (Signed)
 Addressed on prior note.

## 2024-07-12 NOTE — Telephone Encounter (Signed)
 Copied from CRM #[id number]. Topic: Clinical - Medication Question >> Jul 12, 2024 12:30 PM Tysheama G wrote: Reason for CRM: Patient stated medication/antibiotic was supposed to be called in her for her sinus infection by lunch time today and patient still hasn't heard anything . Callback number [phone]

## 2024-07-12 NOTE — Telephone Encounter (Signed)
 Spoke with pt and she is requesting to have some medication called in for her. She saw Dr. Watt on 07/04/24 and tested positive for COVID. Apologized to the for her call not being returned yesterday. Message will be sent to Dr. Watt to address.

## 2024-07-12 NOTE — Telephone Encounter (Signed)
 Please see nurse triage note from 07/11/24

## 2024-07-12 NOTE — Telephone Encounter (Signed)
 Spoke with pt and she is aware of Dr. Eleanore response. I again apologized for her call not being returned yesterday.

## 2024-08-01 DIAGNOSIS — Z1231 Encounter for screening mammogram for malignant neoplasm of breast: Secondary | ICD-10-CM | POA: Diagnosis not present

## 2024-08-01 LAB — HM MAMMOGRAPHY

## 2024-08-02 ENCOUNTER — Encounter: Payer: Self-pay | Admitting: Primary Care

## 2025-01-10 ENCOUNTER — Encounter: Admitting: Primary Care
